# Patient Record
Sex: Female | Born: 1950
Health system: Southern US, Community
[De-identification: ages and names within clinical notes are randomized; demographics above are authoritative.]

## PROBLEM LIST (undated history)

## (undated) DIAGNOSIS — E669 Obesity, unspecified: Secondary | ICD-10-CM

## (undated) DIAGNOSIS — K449 Diaphragmatic hernia without obstruction or gangrene: Secondary | ICD-10-CM

## (undated) DIAGNOSIS — N200 Calculus of kidney: Secondary | ICD-10-CM

## (undated) DIAGNOSIS — K219 Gastro-esophageal reflux disease without esophagitis: Secondary | ICD-10-CM

## (undated) DIAGNOSIS — E785 Hyperlipidemia, unspecified: Secondary | ICD-10-CM

## (undated) DIAGNOSIS — M1990 Unspecified osteoarthritis, unspecified site: Secondary | ICD-10-CM

## (undated) DIAGNOSIS — IMO0001 Reserved for inherently not codable concepts without codable children: Secondary | ICD-10-CM

## (undated) DIAGNOSIS — I10 Essential (primary) hypertension: Secondary | ICD-10-CM

## (undated) DIAGNOSIS — I482 Chronic atrial fibrillation, unspecified: Secondary | ICD-10-CM

## (undated) HISTORY — DX: Calculus of kidney: N20.0

## (undated) HISTORY — DX: Reserved for inherently not codable concepts without codable children: IMO0001

## (undated) HISTORY — DX: Unspecified osteoarthritis, unspecified site: M19.90

## (undated) HISTORY — PX: ABDOMINAL HYSTERECTOMY: SHX81

## (undated) HISTORY — DX: Obesity, unspecified: E66.9

## (undated) HISTORY — DX: Gastro-esophageal reflux disease without esophagitis: K21.9

## (undated) HISTORY — DX: Diaphragmatic hernia without obstruction or gangrene: K44.9

## (undated) HISTORY — DX: Chronic atrial fibrillation, unspecified: I48.20

## (undated) HISTORY — PX: CHOLECYSTECTOMY: SHX55

---

## 1998-09-26 ENCOUNTER — Emergency Department (HOSPITAL_COMMUNITY): Admission: EM | Admit: 1998-09-26 | Discharge: 1998-09-26 | Payer: Self-pay | Admitting: Emergency Medicine

## 1999-07-30 ENCOUNTER — Ambulatory Visit (HOSPITAL_COMMUNITY): Admission: RE | Admit: 1999-07-30 | Discharge: 1999-07-30 | Payer: Self-pay | Admitting: Family Medicine

## 2000-08-15 ENCOUNTER — Encounter: Payer: Self-pay | Admitting: Emergency Medicine

## 2000-08-15 ENCOUNTER — Emergency Department (HOSPITAL_COMMUNITY): Admission: EM | Admit: 2000-08-15 | Discharge: 2000-08-15 | Payer: Self-pay | Admitting: Emergency Medicine

## 2001-05-12 ENCOUNTER — Encounter: Payer: Self-pay | Admitting: Family Medicine

## 2001-05-12 ENCOUNTER — Encounter: Admission: RE | Admit: 2001-05-12 | Discharge: 2001-05-12 | Payer: Self-pay | Admitting: Family Medicine

## 2004-09-11 ENCOUNTER — Inpatient Hospital Stay (HOSPITAL_COMMUNITY): Admission: AD | Admit: 2004-09-11 | Discharge: 2004-09-15 | Payer: Self-pay | Admitting: *Deleted

## 2004-12-17 ENCOUNTER — Observation Stay (HOSPITAL_COMMUNITY): Admission: EM | Admit: 2004-12-17 | Discharge: 2004-12-18 | Payer: Self-pay | Admitting: Emergency Medicine

## 2006-03-25 ENCOUNTER — Encounter: Admission: RE | Admit: 2006-03-25 | Discharge: 2006-03-25 | Payer: Self-pay | Admitting: Family Medicine

## 2011-01-13 ENCOUNTER — Emergency Department (HOSPITAL_BASED_OUTPATIENT_CLINIC_OR_DEPARTMENT_OTHER)
Admission: EM | Admit: 2011-01-13 | Discharge: 2011-01-14 | Disposition: A | Discharge status: Home / Self Care | Payer: Medicare Other | Attending: Emergency Medicine | Admitting: Emergency Medicine

## 2011-01-13 DIAGNOSIS — I10 Essential (primary) hypertension: Secondary | ICD-10-CM | POA: Insufficient documentation

## 2011-01-13 DIAGNOSIS — I4891 Unspecified atrial fibrillation: Secondary | ICD-10-CM | POA: Insufficient documentation

## 2011-01-13 DIAGNOSIS — K429 Umbilical hernia without obstruction or gangrene: Secondary | ICD-10-CM | POA: Insufficient documentation

## 2011-01-13 DIAGNOSIS — E669 Obesity, unspecified: Secondary | ICD-10-CM | POA: Insufficient documentation

## 2011-01-13 DIAGNOSIS — N1330 Unspecified hydronephrosis: Secondary | ICD-10-CM | POA: Insufficient documentation

## 2011-01-13 DIAGNOSIS — K219 Gastro-esophageal reflux disease without esophagitis: Secondary | ICD-10-CM | POA: Insufficient documentation

## 2011-01-13 DIAGNOSIS — E785 Hyperlipidemia, unspecified: Secondary | ICD-10-CM | POA: Insufficient documentation

## 2011-01-13 DIAGNOSIS — Z79899 Other long term (current) drug therapy: Secondary | ICD-10-CM | POA: Insufficient documentation

## 2011-01-13 LAB — URINE MICROSCOPIC-ADD ON

## 2011-01-13 LAB — URINALYSIS, ROUTINE W REFLEX MICROSCOPIC
Bilirubin Urine: NEGATIVE
Glucose, UA: NEGATIVE mg/dL
Ketones, ur: NEGATIVE mg/dL
Leukocytes, UA: NEGATIVE
Nitrite: NEGATIVE
Protein, ur: 30 mg/dL — AB
Specific Gravity, Urine: 1.013 (ref 1.005–1.030)
Urobilinogen, UA: 0.2 mg/dL (ref 0.0–1.0)
pH: 6.5 (ref 5.0–8.0)

## 2011-01-14 ENCOUNTER — Emergency Department (INDEPENDENT_AMBULATORY_CARE_PROVIDER_SITE_OTHER): Payer: Medicare Other

## 2011-01-14 DIAGNOSIS — Z90710 Acquired absence of both cervix and uterus: Secondary | ICD-10-CM

## 2011-01-14 DIAGNOSIS — Z9089 Acquired absence of other organs: Secondary | ICD-10-CM

## 2011-01-14 DIAGNOSIS — N201 Calculus of ureter: Secondary | ICD-10-CM

## 2011-01-14 LAB — CBC
HCT: 43.8 % (ref 36.0–46.0)
Hemoglobin: 15 g/dL (ref 12.0–15.0)
MCH: 28.1 pg (ref 26.0–34.0)
MCHC: 34.2 g/dL (ref 30.0–36.0)
MCV: 82 fL (ref 78.0–100.0)
RDW: 15.5 % (ref 11.5–15.5)

## 2011-01-14 LAB — DIFFERENTIAL
Basophils Relative: 0 % (ref 0–1)
Eosinophils Relative: 1 % (ref 0–5)
Lymphs Abs: 2.6 10*3/uL (ref 0.7–4.0)
Monocytes Absolute: 1.4 10*3/uL — ABNORMAL HIGH (ref 0.1–1.0)
Monocytes Relative: 10 % (ref 3–12)
Neutro Abs: 9.4 10*3/uL — ABNORMAL HIGH (ref 1.7–7.7)

## 2011-01-14 LAB — COMPREHENSIVE METABOLIC PANEL
ALT: 25 U/L (ref 0–35)
BUN: 20 mg/dL (ref 6–23)
CO2: 27 mEq/L (ref 19–32)
Calcium: 9.2 mg/dL (ref 8.4–10.5)
Creatinine, Ser: 0.7 mg/dL (ref 0.4–1.2)
GFR calc non Af Amer: >60 mL/min (ref >60)
Glucose, Bld: 121 mg/dL — ABNORMAL HIGH (ref 70–99)
Total Protein: 8 g/dL (ref 6.0–8.3)

## 2011-01-14 LAB — LIPASE, BLOOD: Lipase: 188 U/L (ref 23–300)

## 2011-01-14 LAB — APTT: aPTT: 78 seconds — ABNORMAL HIGH (ref 24–37)

## 2011-04-12 ENCOUNTER — Emergency Department (INDEPENDENT_AMBULATORY_CARE_PROVIDER_SITE_OTHER): Payer: Medicare Other

## 2011-04-12 ENCOUNTER — Emergency Department (HOSPITAL_BASED_OUTPATIENT_CLINIC_OR_DEPARTMENT_OTHER)
Admission: EM | Admit: 2011-04-12 | Discharge: 2011-04-12 | Disposition: A | Discharge status: Home / Self Care | Payer: Medicare Other | Attending: Emergency Medicine | Admitting: Emergency Medicine

## 2011-04-12 ENCOUNTER — Encounter: Payer: Self-pay | Admitting: *Deleted

## 2011-04-12 DIAGNOSIS — I4891 Unspecified atrial fibrillation: Secondary | ICD-10-CM | POA: Insufficient documentation

## 2011-04-12 DIAGNOSIS — W19XXXA Unspecified fall, initial encounter: Secondary | ICD-10-CM

## 2011-04-12 DIAGNOSIS — S0083XA Contusion of other part of head, initial encounter: Secondary | ICD-10-CM

## 2011-04-12 DIAGNOSIS — IMO0002 Reserved for concepts with insufficient information to code with codable children: Secondary | ICD-10-CM

## 2011-04-12 DIAGNOSIS — S0990XA Unspecified injury of head, initial encounter: Secondary | ICD-10-CM

## 2011-04-12 DIAGNOSIS — R51 Headache: Secondary | ICD-10-CM | POA: Insufficient documentation

## 2011-04-12 DIAGNOSIS — I10 Essential (primary) hypertension: Secondary | ICD-10-CM | POA: Insufficient documentation

## 2011-04-12 DIAGNOSIS — E785 Hyperlipidemia, unspecified: Secondary | ICD-10-CM | POA: Insufficient documentation

## 2011-04-12 DIAGNOSIS — S0003XA Contusion of scalp, initial encounter: Secondary | ICD-10-CM

## 2011-04-12 DIAGNOSIS — W101XXA Fall (on)(from) sidewalk curb, initial encounter: Secondary | ICD-10-CM | POA: Insufficient documentation

## 2011-04-12 HISTORY — DX: Essential (primary) hypertension: I10

## 2011-04-12 HISTORY — DX: Hyperlipidemia, unspecified: E78.5

## 2011-04-12 MED ORDER — KETOROLAC TROMETHAMINE 60 MG/2ML IM SOLN
60.0000 mg | Freq: Once | INTRAMUSCULAR | Status: DC
  Filled 2011-04-12: qty 2

## 2011-04-12 MED ORDER — HYDROCODONE-ACETAMINOPHEN 5-500 MG PO TABS: 1.0000 | ORAL_TABLET | Freq: Four times a day (QID) | ORAL | Status: AC | PRN

## 2011-04-12 NOTE — ED Provider Notes (Signed)
History     Chief Complaint  Patient presents with  . Fall   HPI Comments: Pt with fall that occurred approx 2.5 hours ago when she was stepping up a curb and hit her R forehead and upper lip on the concrete.  She tried to catch herself with her R hand but it was a little too late and now she has pain in her face, tooth and orrehead.  Pain is constant, mild and worse with palpation, mild associated headache but no LOC, visual change and has been ambulatory without difficulty.  She is on coumadin for afib.  Patient is a 60 y.o. female presenting with fall. The history is provided by the patient.  Fall Associated symptoms include headaches. Pertinent negatives include no fever, no numbness, no abdominal pain, no nausea and no vomiting.    Past Medical History  Diagnosis Date  . Atrial fibrillation   . Hypertension   . Hyperlipidemia     Past Surgical History  Procedure Date  . Abdominal hysterectomy   . Cholecystectomy     History reviewed. No pertinent family history.  History  Substance Use Topics  . Smoking status: Never Smoker   . Smokeless tobacco: Not on file  . Alcohol Use:     OB History    Grav Para Term Preterm Abortions TAB SAB Ect Mult Living                  Review of Systems  Constitutional: Negative for fever and chills.  HENT: Negative for sore throat and neck pain.   Eyes: Negative for visual disturbance.  Respiratory: Negative for cough and shortness of breath.   Cardiovascular: Negative for chest pain.  Gastrointestinal: Negative for nausea, vomiting, abdominal pain and diarrhea.  Genitourinary: Negative for dysuria and frequency.  Musculoskeletal: Negative for back pain.  Skin: Negative for rash.       bruising  Neurological: Positive for headaches. Negative for weakness and numbness.  Hematological: Negative for adenopathy.  Psychiatric/Behavioral: Negative for behavioral problems.    Physical Exam  BP 149/74  Pulse 83  Temp(Src) 98.6 F  (37 C) (Oral)  Resp 16  Wt 300 lb (136.079 kg)  SpO2 98%  Physical Exam  Constitutional: She appears well-developed and well-nourished. No distress.  HENT:  Head: Normocephalic.  Mouth/Throat: Oropharynx is clear and moist. No oropharyngeal exudate.       Contusion and abrasion to the R forehad, ttp ove the nasal bridge and ttp ove rthe upper R cdentral incisor without luxation.  Eyes: Conjunctivae and EOM are normal. Pupils are equal, round, and reactive to light. Right eye exhibits no discharge. Left eye exhibits no discharge. No scleral icterus.  Neck: Normal range of motion. Neck supple. No JVD present. No thyromegaly present.  Cardiovascular: Normal rate, regular rhythm, normal heart sounds and intact distal pulses.  Exam reveals no gallop and no friction rub.   No murmur heard. Pulmonary/Chest: Effort normal and breath sounds normal. No respiratory distress. She has no wheezes. She has no rales.  Abdominal: Soft. Bowel sounds are normal. She exhibits no distension and no mass. There is no tenderness.  Musculoskeletal: Normal range of motion. She exhibits no edema and no tenderness.       Entire spine non tender  Lymphadenopathy:    She has no cervical adenopathy.  Neurological: She is alert. Coordination normal.  Skin: Skin is warm and dry. No rash noted. She is not diaphoretic. No erythema.  No rash or abrasion over the hands, facial abrasion as noted  Psychiatric: She has a normal mood and affect. Her behavior is normal.    ED Course  Procedures  MDM Pt concerned due to anticoag - CT head and xray face to minimize radiation - has ambulated in the room, has unremark VS - pain meds deferred till after CT as she is here by driving self and can't have narcs and drive home - pt in agreement, Toradol if neg.   Xrays neg for frx or ICH.  Will d/c home.  Vida Roller, MD 04/12/11 1739

## 2011-04-12 NOTE — ED Notes (Signed)
Pt c/o fall from standing x 1 hr ago landing on cement, abrasion to head and upper lip

## 2011-09-08 ENCOUNTER — Emergency Department (HOSPITAL_COMMUNITY)
Admission: EM | Admit: 2011-09-08 | Discharge: 2011-09-08 | Disposition: A | Discharge status: Home / Self Care | Payer: Medicare Other | Attending: Emergency Medicine | Admitting: Emergency Medicine

## 2011-09-08 ENCOUNTER — Encounter (HOSPITAL_COMMUNITY): Payer: Self-pay | Admitting: Emergency Medicine

## 2011-09-08 DIAGNOSIS — M5380 Other specified dorsopathies, site unspecified: Secondary | ICD-10-CM | POA: Insufficient documentation

## 2011-09-08 DIAGNOSIS — I10 Essential (primary) hypertension: Secondary | ICD-10-CM | POA: Insufficient documentation

## 2011-09-08 DIAGNOSIS — Z7901 Long term (current) use of anticoagulants: Secondary | ICD-10-CM | POA: Insufficient documentation

## 2011-09-08 DIAGNOSIS — E785 Hyperlipidemia, unspecified: Secondary | ICD-10-CM | POA: Insufficient documentation

## 2011-09-08 DIAGNOSIS — M62830 Muscle spasm of back: Secondary | ICD-10-CM

## 2011-09-08 DIAGNOSIS — M549 Dorsalgia, unspecified: Secondary | ICD-10-CM

## 2011-09-08 DIAGNOSIS — I4891 Unspecified atrial fibrillation: Secondary | ICD-10-CM | POA: Insufficient documentation

## 2011-09-08 LAB — URINALYSIS, ROUTINE W REFLEX MICROSCOPIC
Bilirubin Urine: NEGATIVE
Glucose, UA: NEGATIVE mg/dL
Hgb urine dipstick: NEGATIVE
Ketones, ur: NEGATIVE mg/dL
Nitrite: NEGATIVE
Protein, ur: NEGATIVE mg/dL
Specific Gravity, Urine: 1.007 (ref 1.005–1.030)
Urobilinogen, UA: 0.2 mg/dL (ref 0.0–1.0)
pH: 6.5 (ref 5.0–8.0)

## 2011-09-08 LAB — URINE MICROSCOPIC-ADD ON

## 2011-09-08 MED ORDER — GI COCKTAIL ~~LOC~~
30.0000 mL | Freq: Once | ORAL | Status: DC
  Filled 2011-09-08: qty 30

## 2011-09-08 MED ORDER — DIAZEPAM 5 MG PO TABS: 5.0000 mg | ORAL_TABLET | Freq: Four times a day (QID) | ORAL | Status: AC | PRN

## 2011-09-08 MED ORDER — ONDANSETRON 8 MG PO TBDP
8.0000 mg | ORAL_TABLET | Freq: Once | ORAL | Status: AC
  Administered 2011-09-08: 8 mg via ORAL
  Filled 2011-09-08: qty 1

## 2011-09-08 MED ORDER — DIAZEPAM 5 MG PO TABS
5.0000 mg | ORAL_TABLET | Freq: Once | ORAL | Status: AC
  Administered 2011-09-08: 5 mg via ORAL
  Filled 2011-09-08: qty 1

## 2011-09-08 MED ORDER — OXYCODONE-ACETAMINOPHEN 5-325 MG PO TABS: 1.0000 | ORAL_TABLET | ORAL | Status: AC | PRN

## 2011-09-08 MED ORDER — ONDANSETRON HCL 4 MG/2ML IJ SOLN: 4.0000 mg | Freq: Once | INTRAMUSCULAR | Status: DC

## 2011-09-08 MED ORDER — HYDROMORPHONE HCL PF 2 MG/ML IJ SOLN
2.0000 mg | Freq: Once | INTRAMUSCULAR | Status: AC
  Administered 2011-09-08: 2 mg via INTRAMUSCULAR
  Filled 2011-09-08: qty 1

## 2011-09-08 NOTE — ED Provider Notes (Signed)
Medical screening examination/treatment/procedure(s) were performed by non-physician practitioner and as supervising physician I was immediately available for consultation/collaboration.  Cyndra Numbers, MD 09/08/11 [phone]

## 2011-09-08 NOTE — ED Provider Notes (Signed)
Medical screening examination/treatment/procedure(s) were performed by non-physician practitioner and as supervising physician I was immediately available for consultation/collaboration.  Annaleia Pence, MD 09/08/11 1559 

## 2011-09-08 NOTE — ED Notes (Signed)
Pt states she is having pain in her back that started last night and has progressively gotten worse throughout the day today  Pt states the pain was so bad earlier she could not walk on her own  Pt states she has back pain before but has never been treated for back pain

## 2011-09-08 NOTE — ED Provider Notes (Signed)
History     CSN: [id number] Arrival date & time: 09/08/2011  1:37 AM   First MD Initiated Contact with Patient 09/08/11 0413      Chief Complaint  Patient presents with  . Back Pain    Patient is a 60 y.o. female presenting with back pain. The history is provided by the patient.  Back Pain  This is a new problem. The current episode started 2 days ago. The problem occurs constantly. The problem has been rapidly worsening. The pain is associated with no known injury. The pain is present in the lumbar spine. The quality of the pain is described as shooting and stabbing. The pain does not radiate. The pain is at a severity of 8/10. Pertinent negatives include no chest pain, no fever, no numbness, no abdominal pain, no bowel incontinence, no perianal numbness, no bladder incontinence, no dysuria, no pelvic pain, no leg pain, no paresthesias, no paresis, no tingling and no weakness. Risk factors include obesity, lack of exercise and a sedentary lifestyle.  Reports onset of muscle spasms to the right lower back Monday evening. Pain initially mild to moderate and patient states was able to manage at home. States that last night pain became severe. States she was unable to ambulate without assistance due to pain. Pain worse with position change and ambulation. Patient denies nausea, vomiting, diarrhea, fever, abdominal pain, UTI sx's, cough or other associated symptoms. States several months ago was seen at [facility] and told that CT scan suggested she may have passed a kidney stone. Patient states this pain is not similar to the pain she experienced with his previous episode.  Past Medical History  Diagnosis Date  . Atrial fibrillation   . Hypertension   . Hyperlipidemia     Past Surgical History  Procedure Date  . Abdominal hysterectomy   . Cholecystectomy     Family History  Problem Relation Age of Onset  . Heart disease Other   . Diabetes Other   . Cancer Other      History  Substance Use Topics  . Smoking status: Never Smoker   . Smokeless tobacco: Not on file  . Alcohol Use: No    OB History    Grav Para Term Preterm Abortions TAB SAB Ect Mult Living                  Review of Systems  Constitutional: Negative.  Negative for fever.  HENT: Negative.   Eyes: Negative.   Respiratory: Negative.   Cardiovascular: Negative.  Negative for chest pain.  Gastrointestinal: Negative.  Negative for abdominal pain and bowel incontinence.  Genitourinary: Negative.  Negative for bladder incontinence, dysuria and pelvic pain.  Musculoskeletal: Positive for back pain.  Skin: Negative.   Neurological: Negative.  Negative for tingling, weakness, numbness and paresthesias.  Hematological: Negative.   Psychiatric/Behavioral: Negative.     Allergies  Demerol and Erythrocin  Home Medications   Current Outpatient Rx  Name Route Sig Dispense Refill  . DILTIAZEM HCL ER COATED BEADS 180 MG PO CP24 Oral Take 180 mg by mouth daily.      Marland Kitchen PRAVASTATIN SODIUM 40 MG PO TABS Oral Take 40 mg by mouth daily.      . WARFARIN SODIUM 5 MG PO TABS Oral Take 7.5 mg by mouth daily. On Monday and Friday patient takes 7.5 all other days 5mg     . ZOLPIDEM TARTRATE ER 12.5 MG PO TBCR Oral Take 12.5 mg by mouth at  bedtime as needed.        BP 140/73  Pulse 78  Resp 20  SpO2 98%  Physical Exam  Constitutional: She is oriented to person, place, and time. She appears well-developed and well-nourished.  HENT:  Head: Normocephalic and atraumatic.  Eyes: Conjunctivae are normal.  Neck: Neck supple.  Cardiovascular: Normal rate and regular rhythm.   Pulmonary/Chest: Effort normal and breath sounds normal.  Abdominal: Soft. Bowel sounds are normal.  Musculoskeletal: Normal range of motion.       Arms: Neurological: She is alert and oriented to person, place, and time.  Skin: Skin is warm and dry. No erythema.  Psychiatric: She has a normal mood and affect.    ED  Course  Procedures patient with low back pain that is not associated with other symptoms. Denies neurological symptoms. Will check  U/a, medicate for pain and reassess. I have discussed this patient with Lissette pass PA who has agreed to assume care of the patient pending UA, reassessment and disposition.   Labs Reviewed  URINALYSIS, ROUTINE W REFLEX MICROSCOPIC   No results found.   No diagnosis found.    MDM  LBP likely musculoskeletal. U/A pending        Leanne Chang, NP 09/08/11 [phone]

## 2011-09-08 NOTE — ED Provider Notes (Signed)
6:16 AM Patient care resumed from [facility].  She states that the patient has been suffering from right lumbar spinal/CVA back spasms.  Per her plan we will treat the patient's pain with Dilaudid and Valium.  UA pending.  If hematuria is found plan to CT for possible kidney stones.  Of note the patient states this pain is not similar to her last stone episode.  Patient will be discharged with close followup with her PCP if symptoms continue and will be given Percocet and Valium for pain and spasms.  7:10 AM UA still pending. Nurse calling lab on status. Pt states having reflux. PO GI cocktail ordered w another round of Zofran. Pt denies being in any pain.   7:21 AM UA complete and WNL. Pt dc w PCP follow up instructions, valium, & percocet per Ms. Schorr's plan   [place], [place] 09/08/11 [phone]

## 2013-07-09 ENCOUNTER — Other Ambulatory Visit: Payer: Self-pay | Admitting: Cardiology

## 2013-07-09 DIAGNOSIS — I10 Essential (primary) hypertension: Secondary | ICD-10-CM

## 2013-07-10 NOTE — Telephone Encounter (Signed)
Pt needs to be set up for annual appt before I can send in refills. Will send in one refill for pt so they have time to set up appt.

## 2013-09-17 ENCOUNTER — Other Ambulatory Visit: Payer: Self-pay | Admitting: Cardiology

## 2013-10-25 ENCOUNTER — Telehealth: Payer: Self-pay

## 2013-10-25 MED ORDER — DILTIAZEM HCL ER COATED BEADS 180 MG PO CP24: 180.0000 mg | ORAL_CAPSULE | Freq: Every day | ORAL | Status: DC

## 2013-10-25 NOTE — Telephone Encounter (Signed)
Pt needs OV. Have not seen since 05/2012. Will send one month worth of meds in.. Please advise pt to make appt with Dr Radford Pax

## 2014-01-16 ENCOUNTER — Other Ambulatory Visit: Payer: Self-pay | Admitting: Cardiology

## 2014-01-22 ENCOUNTER — Encounter: Payer: Self-pay | Admitting: General Surgery

## 2014-01-22 DIAGNOSIS — I495 Sick sinus syndrome: Secondary | ICD-10-CM

## 2014-01-22 DIAGNOSIS — I4891 Unspecified atrial fibrillation: Secondary | ICD-10-CM

## 2014-01-22 DIAGNOSIS — I10 Essential (primary) hypertension: Secondary | ICD-10-CM

## 2014-02-04 ENCOUNTER — Ambulatory Visit (INDEPENDENT_AMBULATORY_CARE_PROVIDER_SITE_OTHER): Payer: Commercial Managed Care - HMO | Admitting: Cardiology

## 2014-02-04 ENCOUNTER — Encounter: Payer: Self-pay | Admitting: Cardiology

## 2014-02-04 VITALS — BP 158/97 | HR 79 | Ht 62.5 in | Wt 286.8 lb

## 2014-02-04 DIAGNOSIS — I482 Chronic atrial fibrillation, unspecified: Secondary | ICD-10-CM | POA: Insufficient documentation

## 2014-02-04 DIAGNOSIS — I4891 Unspecified atrial fibrillation: Secondary | ICD-10-CM

## 2014-02-04 DIAGNOSIS — I10 Essential (primary) hypertension: Secondary | ICD-10-CM

## 2014-02-04 NOTE — Progress Notes (Signed)
[place], [place], [place]  [address] Phone: [phone] Fax:  [phone]  Date:  02/04/2014   ID:  Nancy Thomas, DOB 03-28-1951, MRN [id number]  PCP:  Gara Kroner, MD  Cardiologist:  Fransico Him, MD     History of Present Illness: This is a 63yo WF with a history of chronic atrial fibrillation, chronic systemic anticogulation and HTN who presents today for followup.  She is doing well.  She denies any chest pain, SOB, DOE,  dizziness, palpitations or syncope.  SHe occasionally will have some LE edema is she has been sitting for prolonged periods of time.  Wt Readings from Last 3 Encounters:  02/04/14 286 lb 12.8 oz (130.092 kg)  04/12/11 300 lb (136.079 kg)     Past Medical History  Diagnosis Date  . Hyperlipidemia   . Chronic atrial fibrillation     pt did not want to proceed w cardioversion in may 2012 and is asymptomatic in her afib. on chronic systemic anticoagulations  . Obesity   . Reflux   . Hiatal hernia   . Arthritis     in knees, Severe  . Kidney stone   . Hypertension     Current Outpatient Prescriptions  Medication Sig Dispense Refill  . diltiazem (CARTIA XT) 180 MG 24 hr capsule Take 1 capsule (180 mg total) by mouth daily.  90 capsule  0  . Multiple Vitamin tablet Take 1 tablet by mouth daily.       . pravastatin (PRAVACHOL) 40 MG tablet Take 40 mg by mouth daily.        . ranitidine (ZANTAC) 150 MG capsule Take 150 mg by mouth as needed for heartburn.      . warfarin (COUMADIN) 5 MG tablet Take 7.5 mg by mouth daily. On Monday and Friday patient takes 7.5 all other days 5mg       . zolpidem (AMBIEN) 5 MG tablet Take 5 mg by mouth at bedtime as needed for sleep.       No current facility-administered medications for this visit.    Allergies:    Allergies  Allergen Reactions  . Demerol Nausea And Vomiting  . Erythrocin Nausea And Vomiting  . Lisinopril     Angioedema     Social History:  The patient  reports that she has never  smoked. She does not have any smokeless tobacco history on file. She reports that she does not drink alcohol or use illicit drugs.   Family History:  The patient's family history includes Cancer in her other; Diabetes in her other; Heart disease in her other.   ROS:  Please see the history of present illness.      All other systems reviewed and negative.   PHYSICAL EXAM: VS:  BP 158/97  Pulse 79  Ht 5' 2.5" (1.588 m)  Wt 286 lb 12.8 oz (130.092 kg)  BMI 51.59 kg/m2 Well nourished, well developed, in no acute distress HEENT: normal Neck: no JVD Cardiac:  normal S1, S2; RRR; no murmur Lungs:  clear to auscultation bilaterally, no wheezing, rhonchi or rales Abd: soft, nontender, no hepatomegaly Ext: trace edema Skin: warm and dry Neuro:  CNs 2-12 intact, no focal abnormalities noted  EKG:     Atrial fibrillation with CVR and no ST changes  ASSESSMENT AND PLAN:  1. Chronic atrial fibrillation rate controlled - continue Diltiazem/warfarin 2. HTN elevated today but normal at her PCP office the other day - continue Diltiazem - I have asked  her to check her BP daily for a week and call with results 3. Chronic systemic anticoagulation  Followup with me in 1 year  Signed, Fransico Him, MD 02/04/2014 3:19 PM

## 2014-02-04 NOTE — Patient Instructions (Signed)
Your physician recommends that you continue on your current medications as directed. Please refer to the Current Medication list given to you today.  Your physician has requested that you regularly monitor and record your blood pressure readings at home. Please use the same machine at the same time of day to check your readings and record them for one week and then call us with the results.   Your physician wants you to follow-up in: 1 year with Dr Mallie Snooks will receive a reminder letter in the mail two months in advance. If you don't receive a letter, please call our office to schedule the follow-up appointment.

## 2014-05-08 ENCOUNTER — Other Ambulatory Visit: Payer: Self-pay | Admitting: *Deleted

## 2014-05-08 MED ORDER — DILTIAZEM HCL ER COATED BEADS 180 MG PO CP24: 180.0000 mg | ORAL_CAPSULE | Freq: Every day | ORAL | Status: DC

## 2014-08-01 ENCOUNTER — Other Ambulatory Visit: Payer: Self-pay | Admitting: Cardiology

## 2014-10-24 DIAGNOSIS — Z7901 Long term (current) use of anticoagulants: Secondary | ICD-10-CM | POA: Diagnosis not present

## 2014-10-24 DIAGNOSIS — I4891 Unspecified atrial fibrillation: Secondary | ICD-10-CM | POA: Diagnosis not present

## 2014-11-11 ENCOUNTER — Encounter: Payer: Self-pay | Admitting: Cardiology

## 2014-11-21 DIAGNOSIS — I4891 Unspecified atrial fibrillation: Secondary | ICD-10-CM | POA: Diagnosis not present

## 2014-11-21 DIAGNOSIS — Z7901 Long term (current) use of anticoagulants: Secondary | ICD-10-CM | POA: Diagnosis not present

## 2014-11-29 ENCOUNTER — Encounter: Payer: Self-pay | Admitting: Cardiology

## 2014-12-05 DIAGNOSIS — I480 Paroxysmal atrial fibrillation: Secondary | ICD-10-CM | POA: Diagnosis not present

## 2014-12-05 DIAGNOSIS — Z7901 Long term (current) use of anticoagulants: Secondary | ICD-10-CM | POA: Diagnosis not present

## 2014-12-18 DIAGNOSIS — I4891 Unspecified atrial fibrillation: Secondary | ICD-10-CM | POA: Diagnosis not present

## 2014-12-18 DIAGNOSIS — Z7901 Long term (current) use of anticoagulants: Secondary | ICD-10-CM | POA: Diagnosis not present

## 2015-01-02 DIAGNOSIS — I4891 Unspecified atrial fibrillation: Secondary | ICD-10-CM | POA: Diagnosis not present

## 2015-01-02 DIAGNOSIS — Z7901 Long term (current) use of anticoagulants: Secondary | ICD-10-CM | POA: Diagnosis not present

## 2015-01-31 DIAGNOSIS — E119 Type 2 diabetes mellitus without complications: Secondary | ICD-10-CM | POA: Diagnosis not present

## 2015-01-31 DIAGNOSIS — E782 Mixed hyperlipidemia: Secondary | ICD-10-CM | POA: Diagnosis not present

## 2015-03-04 DIAGNOSIS — Z7901 Long term (current) use of anticoagulants: Secondary | ICD-10-CM | POA: Diagnosis not present

## 2015-03-04 DIAGNOSIS — I4891 Unspecified atrial fibrillation: Secondary | ICD-10-CM | POA: Diagnosis not present

## 2015-04-07 DIAGNOSIS — Z7901 Long term (current) use of anticoagulants: Secondary | ICD-10-CM | POA: Diagnosis not present

## 2015-04-07 DIAGNOSIS — I4891 Unspecified atrial fibrillation: Secondary | ICD-10-CM | POA: Diagnosis not present

## 2015-04-16 DIAGNOSIS — R0789 Other chest pain: Secondary | ICD-10-CM | POA: Diagnosis not present

## 2015-04-16 DIAGNOSIS — S9031XA Contusion of right foot, initial encounter: Secondary | ICD-10-CM | POA: Diagnosis not present

## 2015-04-16 DIAGNOSIS — S20219A Contusion of unspecified front wall of thorax, initial encounter: Secondary | ICD-10-CM | POA: Diagnosis not present

## 2015-05-06 DIAGNOSIS — Z7901 Long term (current) use of anticoagulants: Secondary | ICD-10-CM | POA: Diagnosis not present

## 2015-05-06 DIAGNOSIS — I4891 Unspecified atrial fibrillation: Secondary | ICD-10-CM | POA: Diagnosis not present

## 2015-06-03 DIAGNOSIS — I4891 Unspecified atrial fibrillation: Secondary | ICD-10-CM | POA: Diagnosis not present

## 2015-06-03 DIAGNOSIS — Z7901 Long term (current) use of anticoagulants: Secondary | ICD-10-CM | POA: Diagnosis not present

## 2015-06-10 DIAGNOSIS — I4891 Unspecified atrial fibrillation: Secondary | ICD-10-CM | POA: Diagnosis not present

## 2015-06-10 DIAGNOSIS — Z7901 Long term (current) use of anticoagulants: Secondary | ICD-10-CM | POA: Diagnosis not present

## 2015-06-24 DIAGNOSIS — Z7901 Long term (current) use of anticoagulants: Secondary | ICD-10-CM | POA: Diagnosis not present

## 2015-06-24 DIAGNOSIS — I4891 Unspecified atrial fibrillation: Secondary | ICD-10-CM | POA: Diagnosis not present

## 2015-07-08 DIAGNOSIS — I4891 Unspecified atrial fibrillation: Secondary | ICD-10-CM | POA: Diagnosis not present

## 2015-07-08 DIAGNOSIS — Z7901 Long term (current) use of anticoagulants: Secondary | ICD-10-CM | POA: Diagnosis not present

## 2015-08-05 DIAGNOSIS — I4891 Unspecified atrial fibrillation: Secondary | ICD-10-CM | POA: Diagnosis not present

## 2015-08-05 DIAGNOSIS — Z7901 Long term (current) use of anticoagulants: Secondary | ICD-10-CM | POA: Diagnosis not present

## 2015-08-19 DIAGNOSIS — I4891 Unspecified atrial fibrillation: Secondary | ICD-10-CM | POA: Diagnosis not present

## 2015-08-19 DIAGNOSIS — Z7901 Long term (current) use of anticoagulants: Secondary | ICD-10-CM | POA: Diagnosis not present

## 2015-09-02 DIAGNOSIS — I4891 Unspecified atrial fibrillation: Secondary | ICD-10-CM | POA: Diagnosis not present

## 2015-09-02 DIAGNOSIS — Z7901 Long term (current) use of anticoagulants: Secondary | ICD-10-CM | POA: Diagnosis not present

## 2015-10-07 DIAGNOSIS — Z7901 Long term (current) use of anticoagulants: Secondary | ICD-10-CM | POA: Diagnosis not present

## 2015-10-07 DIAGNOSIS — I4891 Unspecified atrial fibrillation: Secondary | ICD-10-CM | POA: Diagnosis not present

## 2015-11-04 DIAGNOSIS — I4891 Unspecified atrial fibrillation: Secondary | ICD-10-CM | POA: Diagnosis not present

## 2015-11-04 DIAGNOSIS — Z7901 Long term (current) use of anticoagulants: Secondary | ICD-10-CM | POA: Diagnosis not present

## 2015-11-20 DIAGNOSIS — Z7901 Long term (current) use of anticoagulants: Secondary | ICD-10-CM | POA: Diagnosis not present

## 2015-11-20 DIAGNOSIS — I4891 Unspecified atrial fibrillation: Secondary | ICD-10-CM | POA: Diagnosis not present

## 2015-12-02 DIAGNOSIS — Z7901 Long term (current) use of anticoagulants: Secondary | ICD-10-CM | POA: Diagnosis not present

## 2015-12-02 DIAGNOSIS — I4891 Unspecified atrial fibrillation: Secondary | ICD-10-CM | POA: Diagnosis not present

## 2015-12-20 DIAGNOSIS — B349 Viral infection, unspecified: Secondary | ICD-10-CM | POA: Diagnosis not present

## 2015-12-20 DIAGNOSIS — R5383 Other fatigue: Secondary | ICD-10-CM | POA: Diagnosis not present

## 2015-12-30 DIAGNOSIS — I4891 Unspecified atrial fibrillation: Secondary | ICD-10-CM | POA: Diagnosis not present

## 2015-12-30 DIAGNOSIS — Z7901 Long term (current) use of anticoagulants: Secondary | ICD-10-CM | POA: Diagnosis not present

## 2016-01-08 DIAGNOSIS — Z7901 Long term (current) use of anticoagulants: Secondary | ICD-10-CM | POA: Diagnosis not present

## 2016-01-08 DIAGNOSIS — I4891 Unspecified atrial fibrillation: Secondary | ICD-10-CM | POA: Diagnosis not present

## 2016-01-22 DIAGNOSIS — Z7901 Long term (current) use of anticoagulants: Secondary | ICD-10-CM | POA: Diagnosis not present

## 2016-01-22 DIAGNOSIS — I4891 Unspecified atrial fibrillation: Secondary | ICD-10-CM | POA: Diagnosis not present

## 2016-02-17 DIAGNOSIS — Z0000 Encounter for general adult medical examination without abnormal findings: Secondary | ICD-10-CM | POA: Diagnosis not present

## 2016-02-17 DIAGNOSIS — I4891 Unspecified atrial fibrillation: Secondary | ICD-10-CM | POA: Diagnosis not present

## 2016-02-17 DIAGNOSIS — R609 Edema, unspecified: Secondary | ICD-10-CM | POA: Diagnosis not present

## 2016-02-17 DIAGNOSIS — E782 Mixed hyperlipidemia: Secondary | ICD-10-CM | POA: Diagnosis not present

## 2016-02-17 DIAGNOSIS — J309 Allergic rhinitis, unspecified: Secondary | ICD-10-CM | POA: Diagnosis not present

## 2016-02-17 DIAGNOSIS — E119 Type 2 diabetes mellitus without complications: Secondary | ICD-10-CM | POA: Diagnosis not present

## 2016-02-17 DIAGNOSIS — K219 Gastro-esophageal reflux disease without esophagitis: Secondary | ICD-10-CM | POA: Diagnosis not present

## 2016-02-17 DIAGNOSIS — Z87442 Personal history of urinary calculi: Secondary | ICD-10-CM | POA: Diagnosis not present

## 2016-02-17 DIAGNOSIS — I10 Essential (primary) hypertension: Secondary | ICD-10-CM | POA: Diagnosis not present

## 2016-02-18 ENCOUNTER — Other Ambulatory Visit: Payer: Self-pay | Admitting: Family Medicine

## 2016-02-18 DIAGNOSIS — Z1231 Encounter for screening mammogram for malignant neoplasm of breast: Secondary | ICD-10-CM

## 2016-03-02 ENCOUNTER — Ambulatory Visit
Admission: RE | Admit: 2016-03-02 | Discharge: 2016-03-02 | Disposition: A | Discharge status: Home / Self Care | Payer: Commercial Managed Care - HMO | Source: Ambulatory Visit | Attending: Family Medicine | Admitting: Family Medicine

## 2016-03-02 DIAGNOSIS — Z1231 Encounter for screening mammogram for malignant neoplasm of breast: Secondary | ICD-10-CM

## 2016-03-04 ENCOUNTER — Other Ambulatory Visit: Payer: Self-pay | Admitting: Family Medicine

## 2016-03-04 DIAGNOSIS — R928 Other abnormal and inconclusive findings on diagnostic imaging of breast: Secondary | ICD-10-CM

## 2016-03-12 ENCOUNTER — Ambulatory Visit
Admission: RE | Admit: 2016-03-12 | Discharge: 2016-03-12 | Disposition: A | Discharge status: Home / Self Care | Payer: Commercial Managed Care - HMO | Source: Ambulatory Visit | Attending: Family Medicine | Admitting: Family Medicine

## 2016-03-12 ENCOUNTER — Other Ambulatory Visit: Payer: Self-pay | Admitting: Family Medicine

## 2016-03-12 DIAGNOSIS — R921 Mammographic calcification found on diagnostic imaging of breast: Secondary | ICD-10-CM

## 2016-03-12 DIAGNOSIS — R928 Other abnormal and inconclusive findings on diagnostic imaging of breast: Secondary | ICD-10-CM

## 2016-03-16 ENCOUNTER — Ambulatory Visit
Admission: RE | Admit: 2016-03-16 | Discharge: 2016-03-16 | Disposition: A | Discharge status: Home / Self Care | Payer: Commercial Managed Care - HMO | Source: Ambulatory Visit | Attending: Family Medicine | Admitting: Family Medicine

## 2016-03-16 DIAGNOSIS — I4891 Unspecified atrial fibrillation: Secondary | ICD-10-CM | POA: Diagnosis not present

## 2016-03-16 DIAGNOSIS — R921 Mammographic calcification found on diagnostic imaging of breast: Secondary | ICD-10-CM

## 2016-03-16 DIAGNOSIS — D241 Benign neoplasm of right breast: Secondary | ICD-10-CM | POA: Diagnosis not present

## 2016-03-16 DIAGNOSIS — Z7901 Long term (current) use of anticoagulants: Secondary | ICD-10-CM | POA: Diagnosis not present

## 2016-04-14 DIAGNOSIS — I4891 Unspecified atrial fibrillation: Secondary | ICD-10-CM | POA: Diagnosis not present

## 2016-04-14 DIAGNOSIS — Z7901 Long term (current) use of anticoagulants: Secondary | ICD-10-CM | POA: Diagnosis not present

## 2016-04-30 DIAGNOSIS — I4891 Unspecified atrial fibrillation: Secondary | ICD-10-CM | POA: Diagnosis not present

## 2016-04-30 DIAGNOSIS — Z7901 Long term (current) use of anticoagulants: Secondary | ICD-10-CM | POA: Diagnosis not present

## 2016-05-27 DIAGNOSIS — Z7901 Long term (current) use of anticoagulants: Secondary | ICD-10-CM | POA: Diagnosis not present

## 2016-05-27 DIAGNOSIS — I4891 Unspecified atrial fibrillation: Secondary | ICD-10-CM | POA: Diagnosis not present

## 2016-06-08 DIAGNOSIS — Z7901 Long term (current) use of anticoagulants: Secondary | ICD-10-CM | POA: Diagnosis not present

## 2016-06-08 DIAGNOSIS — I4891 Unspecified atrial fibrillation: Secondary | ICD-10-CM | POA: Diagnosis not present

## 2016-06-22 DIAGNOSIS — I4891 Unspecified atrial fibrillation: Secondary | ICD-10-CM | POA: Diagnosis not present

## 2016-06-22 DIAGNOSIS — Z7901 Long term (current) use of anticoagulants: Secondary | ICD-10-CM | POA: Diagnosis not present

## 2016-07-08 DIAGNOSIS — Z7901 Long term (current) use of anticoagulants: Secondary | ICD-10-CM | POA: Diagnosis not present

## 2016-07-22 DIAGNOSIS — Z7901 Long term (current) use of anticoagulants: Secondary | ICD-10-CM | POA: Diagnosis not present

## 2016-07-22 DIAGNOSIS — I4891 Unspecified atrial fibrillation: Secondary | ICD-10-CM | POA: Diagnosis not present

## 2016-08-09 DIAGNOSIS — I4891 Unspecified atrial fibrillation: Secondary | ICD-10-CM | POA: Diagnosis not present

## 2016-08-09 DIAGNOSIS — Z7901 Long term (current) use of anticoagulants: Secondary | ICD-10-CM | POA: Diagnosis not present

## 2016-09-07 DIAGNOSIS — Z7901 Long term (current) use of anticoagulants: Secondary | ICD-10-CM | POA: Diagnosis not present

## 2016-09-07 DIAGNOSIS — I4891 Unspecified atrial fibrillation: Secondary | ICD-10-CM | POA: Diagnosis not present

## 2016-09-23 DIAGNOSIS — Z7901 Long term (current) use of anticoagulants: Secondary | ICD-10-CM | POA: Diagnosis not present

## 2016-09-23 DIAGNOSIS — I4891 Unspecified atrial fibrillation: Secondary | ICD-10-CM | POA: Diagnosis not present

## 2016-09-30 DIAGNOSIS — Z7901 Long term (current) use of anticoagulants: Secondary | ICD-10-CM | POA: Diagnosis not present

## 2016-09-30 DIAGNOSIS — I4891 Unspecified atrial fibrillation: Secondary | ICD-10-CM | POA: Diagnosis not present

## 2016-10-26 DIAGNOSIS — Z7901 Long term (current) use of anticoagulants: Secondary | ICD-10-CM | POA: Diagnosis not present

## 2016-10-26 DIAGNOSIS — I4891 Unspecified atrial fibrillation: Secondary | ICD-10-CM | POA: Diagnosis not present

## 2016-11-23 DIAGNOSIS — I4891 Unspecified atrial fibrillation: Secondary | ICD-10-CM | POA: Diagnosis not present

## 2016-11-23 DIAGNOSIS — Z7901 Long term (current) use of anticoagulants: Secondary | ICD-10-CM | POA: Diagnosis not present

## 2016-12-21 DIAGNOSIS — I4891 Unspecified atrial fibrillation: Secondary | ICD-10-CM | POA: Diagnosis not present

## 2016-12-21 DIAGNOSIS — Z7901 Long term (current) use of anticoagulants: Secondary | ICD-10-CM | POA: Diagnosis not present

## 2017-01-18 DIAGNOSIS — I4891 Unspecified atrial fibrillation: Secondary | ICD-10-CM | POA: Diagnosis not present

## 2017-01-18 DIAGNOSIS — Z7901 Long term (current) use of anticoagulants: Secondary | ICD-10-CM | POA: Diagnosis not present

## 2017-02-18 DIAGNOSIS — Z0000 Encounter for general adult medical examination without abnormal findings: Secondary | ICD-10-CM | POA: Diagnosis not present

## 2017-02-18 DIAGNOSIS — Z1159 Encounter for screening for other viral diseases: Secondary | ICD-10-CM | POA: Diagnosis not present

## 2017-02-18 DIAGNOSIS — Z23 Encounter for immunization: Secondary | ICD-10-CM | POA: Diagnosis not present

## 2017-02-18 DIAGNOSIS — I4891 Unspecified atrial fibrillation: Secondary | ICD-10-CM | POA: Diagnosis not present

## 2017-02-18 DIAGNOSIS — R609 Edema, unspecified: Secondary | ICD-10-CM | POA: Diagnosis not present

## 2017-02-18 DIAGNOSIS — Z87442 Personal history of urinary calculi: Secondary | ICD-10-CM | POA: Diagnosis not present

## 2017-02-18 DIAGNOSIS — K219 Gastro-esophageal reflux disease without esophagitis: Secondary | ICD-10-CM | POA: Diagnosis not present

## 2017-02-18 DIAGNOSIS — J309 Allergic rhinitis, unspecified: Secondary | ICD-10-CM | POA: Diagnosis not present

## 2017-02-18 DIAGNOSIS — I10 Essential (primary) hypertension: Secondary | ICD-10-CM | POA: Diagnosis not present

## 2017-02-18 DIAGNOSIS — E119 Type 2 diabetes mellitus without complications: Secondary | ICD-10-CM | POA: Diagnosis not present

## 2017-02-18 DIAGNOSIS — E782 Mixed hyperlipidemia: Secondary | ICD-10-CM | POA: Diagnosis not present

## 2017-03-03 DIAGNOSIS — J0100 Acute maxillary sinusitis, unspecified: Secondary | ICD-10-CM | POA: Diagnosis not present

## 2017-03-17 DIAGNOSIS — Z8679 Personal history of other diseases of the circulatory system: Secondary | ICD-10-CM | POA: Diagnosis not present

## 2017-03-17 DIAGNOSIS — Z7901 Long term (current) use of anticoagulants: Secondary | ICD-10-CM | POA: Diagnosis not present

## 2017-04-12 DIAGNOSIS — Z13820 Encounter for screening for osteoporosis: Secondary | ICD-10-CM | POA: Diagnosis not present

## 2017-04-12 DIAGNOSIS — Z780 Asymptomatic menopausal state: Secondary | ICD-10-CM | POA: Diagnosis not present

## 2017-04-12 DIAGNOSIS — M8588 Other specified disorders of bone density and structure, other site: Secondary | ICD-10-CM | POA: Diagnosis not present

## 2017-04-14 DIAGNOSIS — Z7901 Long term (current) use of anticoagulants: Secondary | ICD-10-CM | POA: Diagnosis not present

## 2017-04-14 DIAGNOSIS — I4891 Unspecified atrial fibrillation: Secondary | ICD-10-CM | POA: Diagnosis not present

## 2017-05-12 DIAGNOSIS — I4891 Unspecified atrial fibrillation: Secondary | ICD-10-CM | POA: Diagnosis not present

## 2017-05-12 DIAGNOSIS — Z7901 Long term (current) use of anticoagulants: Secondary | ICD-10-CM | POA: Diagnosis not present

## 2017-06-09 DIAGNOSIS — I4891 Unspecified atrial fibrillation: Secondary | ICD-10-CM | POA: Diagnosis not present

## 2017-06-09 DIAGNOSIS — Z7901 Long term (current) use of anticoagulants: Secondary | ICD-10-CM | POA: Diagnosis not present

## 2017-07-07 DIAGNOSIS — I4891 Unspecified atrial fibrillation: Secondary | ICD-10-CM | POA: Diagnosis not present

## 2017-07-07 DIAGNOSIS — Z7901 Long term (current) use of anticoagulants: Secondary | ICD-10-CM | POA: Diagnosis not present

## 2017-08-04 DIAGNOSIS — Z7901 Long term (current) use of anticoagulants: Secondary | ICD-10-CM | POA: Diagnosis not present

## 2017-09-02 DIAGNOSIS — I4891 Unspecified atrial fibrillation: Secondary | ICD-10-CM | POA: Diagnosis not present

## 2017-09-02 DIAGNOSIS — Z7901 Long term (current) use of anticoagulants: Secondary | ICD-10-CM | POA: Diagnosis not present

## 2017-09-15 DIAGNOSIS — Z7901 Long term (current) use of anticoagulants: Secondary | ICD-10-CM | POA: Diagnosis not present

## 2017-09-15 DIAGNOSIS — I4891 Unspecified atrial fibrillation: Secondary | ICD-10-CM | POA: Diagnosis not present

## 2017-10-13 DIAGNOSIS — I4891 Unspecified atrial fibrillation: Secondary | ICD-10-CM | POA: Diagnosis not present

## 2017-10-13 DIAGNOSIS — Z7901 Long term (current) use of anticoagulants: Secondary | ICD-10-CM | POA: Diagnosis not present

## 2017-10-28 DIAGNOSIS — Z7901 Long term (current) use of anticoagulants: Secondary | ICD-10-CM | POA: Diagnosis not present

## 2017-10-28 DIAGNOSIS — I4891 Unspecified atrial fibrillation: Secondary | ICD-10-CM | POA: Diagnosis not present

## 2017-11-10 DIAGNOSIS — I4891 Unspecified atrial fibrillation: Secondary | ICD-10-CM | POA: Diagnosis not present

## 2017-11-10 DIAGNOSIS — Z7901 Long term (current) use of anticoagulants: Secondary | ICD-10-CM | POA: Diagnosis not present

## 2017-12-08 DIAGNOSIS — Z7901 Long term (current) use of anticoagulants: Secondary | ICD-10-CM | POA: Diagnosis not present

## 2017-12-08 DIAGNOSIS — I4891 Unspecified atrial fibrillation: Secondary | ICD-10-CM | POA: Diagnosis not present

## 2018-01-10 DIAGNOSIS — Z7901 Long term (current) use of anticoagulants: Secondary | ICD-10-CM | POA: Diagnosis not present

## 2018-01-10 DIAGNOSIS — I4891 Unspecified atrial fibrillation: Secondary | ICD-10-CM | POA: Diagnosis not present

## 2018-02-07 DIAGNOSIS — Z7901 Long term (current) use of anticoagulants: Secondary | ICD-10-CM | POA: Diagnosis not present

## 2018-02-07 DIAGNOSIS — I4891 Unspecified atrial fibrillation: Secondary | ICD-10-CM | POA: Diagnosis not present

## 2018-02-21 DIAGNOSIS — E782 Mixed hyperlipidemia: Secondary | ICD-10-CM | POA: Diagnosis not present

## 2018-02-21 DIAGNOSIS — I10 Essential (primary) hypertension: Secondary | ICD-10-CM | POA: Diagnosis not present

## 2018-02-21 DIAGNOSIS — K219 Gastro-esophageal reflux disease without esophagitis: Secondary | ICD-10-CM | POA: Diagnosis not present

## 2018-02-21 DIAGNOSIS — M85851 Other specified disorders of bone density and structure, right thigh: Secondary | ICD-10-CM | POA: Diagnosis not present

## 2018-02-21 DIAGNOSIS — E1169 Type 2 diabetes mellitus with other specified complication: Secondary | ICD-10-CM | POA: Diagnosis not present

## 2018-02-21 DIAGNOSIS — Z0000 Encounter for general adult medical examination without abnormal findings: Secondary | ICD-10-CM | POA: Diagnosis not present

## 2018-02-21 DIAGNOSIS — Z1211 Encounter for screening for malignant neoplasm of colon: Secondary | ICD-10-CM | POA: Diagnosis not present

## 2018-02-21 DIAGNOSIS — Z1389 Encounter for screening for other disorder: Secondary | ICD-10-CM | POA: Diagnosis not present

## 2018-02-21 DIAGNOSIS — Z23 Encounter for immunization: Secondary | ICD-10-CM | POA: Diagnosis not present

## 2018-02-21 DIAGNOSIS — I4891 Unspecified atrial fibrillation: Secondary | ICD-10-CM | POA: Diagnosis not present

## 2018-02-23 ENCOUNTER — Other Ambulatory Visit: Payer: Self-pay | Admitting: Family Medicine

## 2018-02-23 DIAGNOSIS — Z1231 Encounter for screening mammogram for malignant neoplasm of breast: Secondary | ICD-10-CM

## 2018-03-21 DIAGNOSIS — Z7901 Long term (current) use of anticoagulants: Secondary | ICD-10-CM | POA: Diagnosis not present

## 2018-03-21 DIAGNOSIS — I4891 Unspecified atrial fibrillation: Secondary | ICD-10-CM | POA: Diagnosis not present

## 2018-04-18 DIAGNOSIS — I4891 Unspecified atrial fibrillation: Secondary | ICD-10-CM | POA: Diagnosis not present

## 2018-04-18 DIAGNOSIS — Z7901 Long term (current) use of anticoagulants: Secondary | ICD-10-CM | POA: Diagnosis not present

## 2018-04-20 ENCOUNTER — Emergency Department (HOSPITAL_COMMUNITY): Payer: Medicare HMO

## 2018-04-20 ENCOUNTER — Encounter (HOSPITAL_COMMUNITY): Payer: Self-pay | Admitting: Emergency Medicine

## 2018-04-20 ENCOUNTER — Inpatient Hospital Stay (HOSPITAL_COMMUNITY)
Admission: EM | Admit: 2018-04-20 | Discharge: 2018-05-05 | DRG: 511 | Disposition: A | Discharge status: Skilled Nursing Facility | Payer: Medicare HMO | Attending: Family Medicine | Admitting: Family Medicine

## 2018-04-20 ENCOUNTER — Inpatient Hospital Stay (HOSPITAL_COMMUNITY): Payer: Medicare HMO

## 2018-04-20 DIAGNOSIS — M25461 Effusion, right knee: Secondary | ICD-10-CM | POA: Diagnosis not present

## 2018-04-20 DIAGNOSIS — E871 Hypo-osmolality and hyponatremia: Secondary | ICD-10-CM | POA: Diagnosis not present

## 2018-04-20 DIAGNOSIS — K219 Gastro-esophageal reflux disease without esophagitis: Secondary | ICD-10-CM | POA: Diagnosis present

## 2018-04-20 DIAGNOSIS — R079 Chest pain, unspecified: Secondary | ICD-10-CM | POA: Diagnosis not present

## 2018-04-20 DIAGNOSIS — S2243XA Multiple fractures of ribs, bilateral, initial encounter for closed fracture: Secondary | ICD-10-CM | POA: Diagnosis present

## 2018-04-20 DIAGNOSIS — R4020 Unspecified coma: Secondary | ICD-10-CM | POA: Diagnosis not present

## 2018-04-20 DIAGNOSIS — R109 Unspecified abdominal pain: Secondary | ICD-10-CM | POA: Diagnosis not present

## 2018-04-20 DIAGNOSIS — R262 Difficulty in walking, not elsewhere classified: Secondary | ICD-10-CM | POA: Diagnosis not present

## 2018-04-20 DIAGNOSIS — J189 Pneumonia, unspecified organism: Secondary | ICD-10-CM

## 2018-04-20 DIAGNOSIS — S301XXA Contusion of abdominal wall, initial encounter: Secondary | ICD-10-CM | POA: Diagnosis not present

## 2018-04-20 DIAGNOSIS — M6281 Muscle weakness (generalized): Secondary | ICD-10-CM | POA: Diagnosis not present

## 2018-04-20 DIAGNOSIS — S52502A Unspecified fracture of the lower end of left radius, initial encounter for closed fracture: Secondary | ICD-10-CM | POA: Diagnosis not present

## 2018-04-20 DIAGNOSIS — I482 Chronic atrial fibrillation, unspecified: Secondary | ICD-10-CM | POA: Diagnosis not present

## 2018-04-20 DIAGNOSIS — R0602 Shortness of breath: Secondary | ICD-10-CM | POA: Diagnosis not present

## 2018-04-20 DIAGNOSIS — S2249XA Multiple fractures of ribs, unspecified side, initial encounter for closed fracture: Secondary | ICD-10-CM | POA: Diagnosis present

## 2018-04-20 DIAGNOSIS — Z885 Allergy status to narcotic agent status: Secondary | ICD-10-CM | POA: Diagnosis not present

## 2018-04-20 DIAGNOSIS — Z6841 Body Mass Index (BMI) 40.0 and over, adult: Secondary | ICD-10-CM | POA: Diagnosis not present

## 2018-04-20 DIAGNOSIS — Z888 Allergy status to other drugs, medicaments and biological substances status: Secondary | ICD-10-CM | POA: Diagnosis not present

## 2018-04-20 DIAGNOSIS — S5292XA Unspecified fracture of left forearm, initial encounter for closed fracture: Secondary | ICD-10-CM | POA: Diagnosis present

## 2018-04-20 DIAGNOSIS — G8918 Other acute postprocedural pain: Secondary | ICD-10-CM

## 2018-04-20 DIAGNOSIS — S52502D Unspecified fracture of the lower end of left radius, subsequent encounter for closed fracture with routine healing: Secondary | ICD-10-CM | POA: Diagnosis not present

## 2018-04-20 DIAGNOSIS — S52302D Unspecified fracture of shaft of left radius, subsequent encounter for closed fracture with routine healing: Secondary | ICD-10-CM | POA: Diagnosis not present

## 2018-04-20 DIAGNOSIS — Y92410 Unspecified street and highway as the place of occurrence of the external cause: Secondary | ICD-10-CM

## 2018-04-20 DIAGNOSIS — R0789 Other chest pain: Secondary | ICD-10-CM | POA: Diagnosis not present

## 2018-04-20 DIAGNOSIS — S52202A Unspecified fracture of shaft of left ulna, initial encounter for closed fracture: Secondary | ICD-10-CM | POA: Diagnosis not present

## 2018-04-20 DIAGNOSIS — Z79899 Other long term (current) drug therapy: Secondary | ICD-10-CM | POA: Diagnosis not present

## 2018-04-20 DIAGNOSIS — S2239XA Fracture of one rib, unspecified side, initial encounter for closed fracture: Secondary | ICD-10-CM

## 2018-04-20 DIAGNOSIS — I10 Essential (primary) hypertension: Secondary | ICD-10-CM | POA: Diagnosis present

## 2018-04-20 DIAGNOSIS — S0990XA Unspecified injury of head, initial encounter: Secondary | ICD-10-CM | POA: Diagnosis not present

## 2018-04-20 DIAGNOSIS — S62102A Fracture of unspecified carpal bone, left wrist, initial encounter for closed fracture: Secondary | ICD-10-CM | POA: Diagnosis not present

## 2018-04-20 DIAGNOSIS — S2241XA Multiple fractures of ribs, right side, initial encounter for closed fracture: Secondary | ICD-10-CM | POA: Diagnosis not present

## 2018-04-20 DIAGNOSIS — D72829 Elevated white blood cell count, unspecified: Secondary | ICD-10-CM

## 2018-04-20 DIAGNOSIS — S199XXA Unspecified injury of neck, initial encounter: Secondary | ICD-10-CM | POA: Diagnosis not present

## 2018-04-20 DIAGNOSIS — S52572A Other intraarticular fracture of lower end of left radius, initial encounter for closed fracture: Principal | ICD-10-CM | POA: Diagnosis present

## 2018-04-20 DIAGNOSIS — T148XXA Other injury of unspecified body region, initial encounter: Secondary | ICD-10-CM

## 2018-04-20 DIAGNOSIS — J90 Pleural effusion, not elsewhere classified: Secondary | ICD-10-CM | POA: Diagnosis not present

## 2018-04-20 DIAGNOSIS — M25572 Pain in left ankle and joints of left foot: Secondary | ICD-10-CM | POA: Diagnosis not present

## 2018-04-20 DIAGNOSIS — E8770 Fluid overload, unspecified: Secondary | ICD-10-CM | POA: Diagnosis not present

## 2018-04-20 DIAGNOSIS — Z7901 Long term (current) use of anticoagulants: Secondary | ICD-10-CM | POA: Diagnosis not present

## 2018-04-20 DIAGNOSIS — S82002A Unspecified fracture of left patella, initial encounter for closed fracture: Secondary | ICD-10-CM | POA: Diagnosis not present

## 2018-04-20 DIAGNOSIS — K5900 Constipation, unspecified: Secondary | ICD-10-CM | POA: Diagnosis not present

## 2018-04-20 DIAGNOSIS — I4891 Unspecified atrial fibrillation: Secondary | ICD-10-CM | POA: Diagnosis not present

## 2018-04-20 DIAGNOSIS — S82001D Unspecified fracture of right patella, subsequent encounter for closed fracture with routine healing: Secondary | ICD-10-CM | POA: Diagnosis not present

## 2018-04-20 DIAGNOSIS — S2249XD Multiple fractures of ribs, unspecified side, subsequent encounter for fracture with routine healing: Secondary | ICD-10-CM | POA: Diagnosis not present

## 2018-04-20 DIAGNOSIS — R0902 Hypoxemia: Secondary | ICD-10-CM | POA: Diagnosis not present

## 2018-04-20 DIAGNOSIS — D62 Acute posthemorrhagic anemia: Secondary | ICD-10-CM | POA: Diagnosis not present

## 2018-04-20 DIAGNOSIS — S99912A Unspecified injury of left ankle, initial encounter: Secondary | ICD-10-CM | POA: Diagnosis not present

## 2018-04-20 DIAGNOSIS — S82024A Nondisplaced longitudinal fracture of right patella, initial encounter for closed fracture: Secondary | ICD-10-CM | POA: Diagnosis not present

## 2018-04-20 DIAGNOSIS — S3991XA Unspecified injury of abdomen, initial encounter: Secondary | ICD-10-CM | POA: Diagnosis not present

## 2018-04-20 DIAGNOSIS — E785 Hyperlipidemia, unspecified: Secondary | ICD-10-CM | POA: Diagnosis not present

## 2018-04-20 DIAGNOSIS — R1084 Generalized abdominal pain: Secondary | ICD-10-CM | POA: Diagnosis not present

## 2018-04-20 DIAGNOSIS — G44309 Post-traumatic headache, unspecified, not intractable: Secondary | ICD-10-CM | POA: Diagnosis not present

## 2018-04-20 DIAGNOSIS — R279 Unspecified lack of coordination: Secondary | ICD-10-CM | POA: Diagnosis not present

## 2018-04-20 DIAGNOSIS — S2231XA Fracture of one rib, right side, initial encounter for closed fracture: Secondary | ICD-10-CM | POA: Diagnosis not present

## 2018-04-20 DIAGNOSIS — D649 Anemia, unspecified: Secondary | ICD-10-CM | POA: Diagnosis not present

## 2018-04-20 DIAGNOSIS — S52562A Barton's fracture of left radius, initial encounter for closed fracture: Secondary | ICD-10-CM | POA: Diagnosis not present

## 2018-04-20 DIAGNOSIS — Z743 Need for continuous supervision: Secondary | ICD-10-CM | POA: Diagnosis not present

## 2018-04-20 DIAGNOSIS — M549 Dorsalgia, unspecified: Secondary | ICD-10-CM | POA: Diagnosis not present

## 2018-04-20 DIAGNOSIS — D500 Iron deficiency anemia secondary to blood loss (chronic): Secondary | ICD-10-CM | POA: Diagnosis not present

## 2018-04-20 DIAGNOSIS — Z419 Encounter for procedure for purposes other than remedying health state, unspecified: Secondary | ICD-10-CM

## 2018-04-20 LAB — COMPREHENSIVE METABOLIC PANEL
ALT: 27 U/L (ref 0–44)
AST: 42 U/L — AB (ref 15–41)
Albumin: 3.7 g/dL (ref 3.5–5.0)
Alkaline Phosphatase: 88 U/L (ref 38–126)
Anion gap: 11 (ref 5–15)
BILIRUBIN TOTAL: 1.1 mg/dL (ref 0.3–1.2)
BUN: 15 mg/dL (ref 8–23)
CO2: 21 mmol/L — ABNORMAL LOW (ref 22–32)
Calcium: 8.9 mg/dL (ref 8.9–10.3)
Chloride: 106 mmol/L (ref 98–111)
Creatinine, Ser: 0.79 mg/dL (ref 0.44–1.00)
GFR calc Af Amer: >60 mL/min (ref >60)
Glucose, Bld: 130 mg/dL — ABNORMAL HIGH (ref 70–99)
Potassium: 4 mmol/L (ref 3.5–5.1)
Sodium: 138 mmol/L (ref 135–145)
TOTAL PROTEIN: 7.1 g/dL (ref 6.5–8.1)

## 2018-04-20 LAB — I-STAT CG4 LACTIC ACID, ED: LACTIC ACID, VENOUS: 1.8 mmol/L (ref 0.5–1.9)

## 2018-04-20 LAB — PROTIME-INR
INR: 1.63
PROTHROMBIN TIME: 19.2 s — AB (ref 11.4–15.2)

## 2018-04-20 LAB — CBC
HCT: 40.7 % (ref 36.0–46.0)
Hemoglobin: 13.4 g/dL (ref 12.0–15.0)
MCH: 30.2 pg (ref 26.0–34.0)
MCHC: 32.9 g/dL (ref 30.0–36.0)
MCV: 91.7 fL (ref 78.0–100.0)
Platelets: 245 10*3/uL (ref 150–400)
RBC: 4.44 MIL/uL (ref 3.87–5.11)
RDW: 13.7 % (ref 11.5–15.5)
WBC: 13.6 10*3/uL — AB (ref 4.0–10.5)

## 2018-04-20 LAB — ETHANOL: Alcohol, Ethyl (B): <10 mg/dL (ref <10)

## 2018-04-20 MED ORDER — DILTIAZEM HCL ER COATED BEADS 180 MG PO CP24
180.0000 mg | ORAL_CAPSULE | Freq: Every day | ORAL | Status: DC
  Administered 2018-04-21: 180 mg via ORAL
  Filled 2018-04-20: qty 1

## 2018-04-20 MED ORDER — MORPHINE SULFATE (PF) 4 MG/ML IV SOLN
4.0000 mg | Freq: Once | INTRAVENOUS | Status: AC
  Administered 2018-04-20: 4 mg via INTRAVENOUS
  Filled 2018-04-20: qty 1

## 2018-04-20 MED ORDER — SODIUM CHLORIDE 0.9 % IV BOLUS
1000.0000 mL | Freq: Once | INTRAVENOUS | Status: AC
  Administered 2018-04-20: 1000 mL via INTRAVENOUS

## 2018-04-20 MED ORDER — IOHEXOL 300 MG/ML  SOLN
100.0000 mL | Freq: Once | INTRAMUSCULAR | Status: AC | PRN
  Administered 2018-04-20: 100 mL via INTRAVENOUS

## 2018-04-20 MED ORDER — OXYCODONE-ACETAMINOPHEN 5-325 MG PO TABS
2.0000 | ORAL_TABLET | Freq: Once | ORAL | Status: DC
  Filled 2018-04-20: qty 2

## 2018-04-20 MED ORDER — MAGNESIUM OXIDE 400 (241.3 MG) MG PO TABS
200.0000 mg | ORAL_TABLET | Freq: Every day | ORAL | Status: DC
  Administered 2018-04-21 – 2018-04-24 (×4): 200 mg via ORAL
  Filled 2018-04-20 (×6): qty 1

## 2018-04-20 MED ORDER — ACETAMINOPHEN 325 MG PO TABS
650.0000 mg | ORAL_TABLET | ORAL | Status: DC | PRN
  Administered 2018-04-21 – 2018-04-23 (×5): 650 mg via ORAL
  Filled 2018-04-20 (×5): qty 2

## 2018-04-20 MED ORDER — ETOMIDATE 2 MG/ML IV SOLN
10.0000 mg | Freq: Once | INTRAVENOUS | Status: AC
  Administered 2018-04-20: 10 mg via INTRAVENOUS
  Filled 2018-04-20: qty 10

## 2018-04-20 MED ORDER — ONDANSETRON HCL 4 MG/2ML IJ SOLN
4.0000 mg | Freq: Four times a day (QID) | INTRAMUSCULAR | Status: DC | PRN
  Administered 2018-04-25 – 2018-05-01 (×3): 4 mg via INTRAVENOUS
  Filled 2018-04-20 (×3): qty 2

## 2018-04-20 MED ORDER — CALCIUM CARBONATE-VITAMIN D 500-200 MG-UNIT PO TABS
1.0000 | ORAL_TABLET | Freq: Every day | ORAL | Status: DC
  Administered 2018-04-21 – 2018-04-24 (×4): 1 via ORAL
  Filled 2018-04-20 (×7): qty 1

## 2018-04-20 MED ORDER — DILTIAZEM HCL 25 MG/5ML IV SOLN: 25.0000 mg | Freq: Once | INTRAVENOUS | Status: DC

## 2018-04-20 MED ORDER — VITAMIN D 1000 UNITS PO TABS
1000.0000 [IU] | ORAL_TABLET | Freq: Every day | ORAL | Status: DC
Start: 2018-04-21 — End: 2018-05-06
  Administered 2018-04-21 – 2018-04-24 (×4): 1000 [IU] via ORAL
  Filled 2018-04-20 (×7): qty 1

## 2018-04-20 MED ORDER — HEPARIN (PORCINE) IN NACL 100-0.45 UNIT/ML-% IJ SOLN
1400.0000 [IU]/h | INTRAMUSCULAR | Status: DC
  Administered 2018-04-21: 1400 [IU]/h via INTRAVENOUS
  Filled 2018-04-20: qty 250

## 2018-04-20 MED ORDER — DILTIAZEM HCL 60 MG PO TABS
60.0000 mg | ORAL_TABLET | Freq: Once | ORAL | Status: AC
  Administered 2018-04-20: 60 mg via ORAL
  Filled 2018-04-20: qty 1

## 2018-04-20 MED ORDER — ONDANSETRON HCL 4 MG/2ML IJ SOLN
4.0000 mg | Freq: Once | INTRAMUSCULAR | Status: AC
  Administered 2018-04-20: 4 mg via INTRAVENOUS
  Filled 2018-04-20: qty 2

## 2018-04-20 MED ORDER — DILTIAZEM HCL-DEXTROSE 100-5 MG/100ML-% IV SOLN (PREMIX)
5.0000 mg/h | INTRAVENOUS | Status: DC
  Administered 2018-04-21: 5 mg/h via INTRAVENOUS
  Administered 2018-04-21: 7.5 mg/h via INTRAVENOUS
  Filled 2018-04-20 (×3): qty 100

## 2018-04-20 MED ORDER — MIDAZOLAM HCL 2 MG/2ML IJ SOLN
2.0000 mg | Freq: Once | INTRAMUSCULAR | Status: AC
  Administered 2018-04-20: 1 mg via INTRAVENOUS
  Filled 2018-04-20: qty 2

## 2018-04-20 MED ORDER — PRAVASTATIN SODIUM 40 MG PO TABS
40.0000 mg | ORAL_TABLET | Freq: Every day | ORAL | Status: DC
  Administered 2018-04-21 – 2018-05-01 (×11): 40 mg via ORAL
  Filled 2018-04-20 (×11): qty 1
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
## 2018-04-20 NOTE — Progress Notes (Signed)
Discussed case with ER provider.  Patient with closed left distal radius comminuted fracture with significant displacement, median nerve is intact.  She also has multiple other rib fractures, questionable distal tibia fracture although it looks like an avulsion, and is likely old, as there does not seem to be much tenderness in that location.  Plan for trauma admission, observation overnight, closed reduction in the emergency room by the emergency room providers, and likely definitive surgical fixation for her distal radius tomorrow.  NPO after midnight.  Full consult to follow.   Johnny Bridge, MD
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
## 2018-04-21 ENCOUNTER — Inpatient Hospital Stay (HOSPITAL_COMMUNITY): Payer: Medicare HMO

## 2018-04-21 ENCOUNTER — Inpatient Hospital Stay (HOSPITAL_COMMUNITY): Payer: Medicare HMO | Admitting: Anesthesiology

## 2018-04-21 ENCOUNTER — Encounter (HOSPITAL_COMMUNITY)
Admission: EM | Disposition: A | Discharge status: Skilled Nursing Facility | Payer: Self-pay | Source: Home / Self Care | Attending: Family Medicine

## 2018-04-21 ENCOUNTER — Other Ambulatory Visit: Payer: Self-pay

## 2018-04-21 ENCOUNTER — Encounter (HOSPITAL_COMMUNITY): Payer: Self-pay

## 2018-04-21 DIAGNOSIS — T148XXA Other injury of unspecified body region, initial encounter: Secondary | ICD-10-CM

## 2018-04-21 DIAGNOSIS — S2249XD Multiple fractures of ribs, unspecified side, subsequent encounter for fracture with routine healing: Secondary | ICD-10-CM

## 2018-04-21 DIAGNOSIS — I4891 Unspecified atrial fibrillation: Secondary | ICD-10-CM

## 2018-04-21 HISTORY — PX: OPEN REDUCTION INTERNAL FIXATION (ORIF) DISTAL RADIAL FRACTURE: SHX5989

## 2018-04-21 LAB — URINALYSIS, ROUTINE W REFLEX MICROSCOPIC
Bilirubin Urine: NEGATIVE
Glucose, UA: NEGATIVE mg/dL
Ketones, ur: 5 mg/dL — AB
Nitrite: NEGATIVE
Protein, ur: NEGATIVE mg/dL
SPECIFIC GRAVITY, URINE: 1.029 (ref 1.005–1.030)
pH: 5 (ref 5.0–8.0)

## 2018-04-21 LAB — CBC
HEMATOCRIT: 35.8 % — AB (ref 36.0–46.0)
Hemoglobin: 11.8 g/dL — ABNORMAL LOW (ref 12.0–15.0)
MCH: 29.7 pg (ref 26.0–34.0)
MCHC: 33 g/dL (ref 30.0–36.0)
MCV: 90.2 fL (ref 78.0–100.0)
Platelets: 224 10*3/uL (ref 150–400)
RBC: 3.97 MIL/uL (ref 3.87–5.11)
RDW: 13.8 % (ref 11.5–15.5)
WBC: 7.8 10*3/uL (ref 4.0–10.5)

## 2018-04-21 LAB — MRSA PCR SCREENING: MRSA BY PCR: NEGATIVE

## 2018-04-21 LAB — BASIC METABOLIC PANEL
Anion gap: 10 (ref 5–15)
BUN: 10 mg/dL (ref 8–23)
CHLORIDE: 104 mmol/L (ref 98–111)
CO2: 23 mmol/L (ref 22–32)
Calcium: 8.4 mg/dL — ABNORMAL LOW (ref 8.9–10.3)
Creatinine, Ser: 0.72 mg/dL (ref 0.44–1.00)
GFR calc non Af Amer: >60 mL/min (ref >60)
Glucose, Bld: 192 mg/dL — ABNORMAL HIGH (ref 70–99)
POTASSIUM: 4.1 mmol/L (ref 3.5–5.1)
Sodium: 137 mmol/L (ref 135–145)

## 2018-04-21 LAB — ECHOCARDIOGRAM COMPLETE
Height: 63 in
WEIGHTICAEL: 4419.78 [oz_av]

## 2018-04-21 LAB — HIV ANTIBODY (ROUTINE TESTING W REFLEX): HIV Screen 4th Generation wRfx: NONREACTIVE

## 2018-04-21 LAB — HEPARIN LEVEL (UNFRACTIONATED): HEPARIN UNFRACTIONATED: 1.04 [IU]/mL — AB (ref 0.30–0.70)

## 2018-04-21 SURGERY — OPEN REDUCTION INTERNAL FIXATION (ORIF) DISTAL RADIUS FRACTURE
Anesthesia: General | Laterality: Left

## 2018-04-21 MED ORDER — SUCCINYLCHOLINE CHLORIDE 20 MG/ML IJ SOLN
INTRAMUSCULAR | Status: DC | PRN
  Administered 2018-04-21: 120 mg via INTRAVENOUS

## 2018-04-21 MED ORDER — DEXAMETHASONE SODIUM PHOSPHATE 10 MG/ML IJ SOLN
INTRAMUSCULAR | Status: DC | PRN
  Administered 2018-04-21: 10 mg via INTRAVENOUS

## 2018-04-21 MED ORDER — METHOCARBAMOL 500 MG PO TABS: 500.0000 mg | ORAL_TABLET | Freq: Three times a day (TID) | ORAL | Status: DC | PRN

## 2018-04-21 MED ORDER — 0.9 % SODIUM CHLORIDE (POUR BTL) OPTIME
TOPICAL | Status: DC | PRN
  Administered 2018-04-21: 1000 mL

## 2018-04-21 MED ORDER — LACTATED RINGERS IV SOLN
INTRAVENOUS | Status: DC
  Administered 2018-04-21 (×2): via INTRAVENOUS

## 2018-04-21 MED ORDER — PROMETHAZINE HCL 25 MG/ML IJ SOLN
6.2500 mg | INTRAMUSCULAR | Status: DC | PRN
  Administered 2018-04-21: 6.25 mg via INTRAVENOUS

## 2018-04-21 MED ORDER — MORPHINE SULFATE (PF) 2 MG/ML IV SOLN
1.0000 mg | INTRAVENOUS | Status: DC | PRN
  Administered 2018-04-21: 1 mg via INTRAVENOUS
  Administered 2018-04-22: 2 mg via INTRAVENOUS
  Filled 2018-04-21 (×2): qty 1

## 2018-04-21 MED ORDER — ROCURONIUM BROMIDE 100 MG/10ML IV SOLN
INTRAVENOUS | Status: DC | PRN
  Administered 2018-04-21: 50 mg via INTRAVENOUS

## 2018-04-21 MED ORDER — SUGAMMADEX SODIUM 200 MG/2ML IV SOLN
INTRAVENOUS | Status: AC
  Filled 2018-04-21: qty 4

## 2018-04-21 MED ORDER — FENTANYL CITRATE (PF) 100 MCG/2ML IJ SOLN
INTRAMUSCULAR | Status: DC | PRN
  Administered 2018-04-21: 50 ug via INTRAVENOUS
  Administered 2018-04-21 (×2): 100 ug via INTRAVENOUS

## 2018-04-21 MED ORDER — DEXTROSE 5 % IV SOLN
INTRAVENOUS | Status: DC | PRN
  Administered 2018-04-21: 21:00:00 via INTRAVENOUS
  Administered 2018-04-21: 7.5 mg/h via INTRAVENOUS

## 2018-04-21 MED ORDER — FENTANYL CITRATE (PF) 250 MCG/5ML IJ SOLN
INTRAMUSCULAR | Status: AC
  Filled 2018-04-21: qty 5

## 2018-04-21 MED ORDER — HYDROMORPHONE HCL 1 MG/ML IJ SOLN
INTRAMUSCULAR | Status: AC
  Filled 2018-04-21: qty 1

## 2018-04-21 MED ORDER — DILTIAZEM HCL ER COATED BEADS 180 MG PO CP24
180.0000 mg | ORAL_CAPSULE | Freq: Every day | ORAL | Status: DC
  Administered 2018-04-22 – 2018-04-29 (×8): 180 mg via ORAL
  Filled 2018-04-21 (×8): qty 1

## 2018-04-21 MED ORDER — SUGAMMADEX SODIUM 200 MG/2ML IV SOLN
INTRAVENOUS | Status: DC | PRN
  Administered 2018-04-21: 250 mg via INTRAVENOUS

## 2018-04-21 MED ORDER — LIDOCAINE HCL (CARDIAC) PF 100 MG/5ML IV SOSY
PREFILLED_SYRINGE | INTRAVENOUS | Status: DC | PRN
  Administered 2018-04-21: 30 mg via INTRAVENOUS

## 2018-04-21 MED ORDER — POVIDONE-IODINE 10 % EX SWAB: 2.0000 "application " | Freq: Once | CUTANEOUS | Status: DC

## 2018-04-21 MED ORDER — OXYCODONE HCL 5 MG PO TABS
5.0000 mg | ORAL_TABLET | Freq: Four times a day (QID) | ORAL | Status: DC | PRN
  Administered 2018-04-21 – 2018-04-22 (×3): 5 mg via ORAL
  Filled 2018-04-21 (×3): qty 1

## 2018-04-21 MED ORDER — MIDAZOLAM HCL 2 MG/2ML IJ SOLN
INTRAMUSCULAR | Status: AC
  Filled 2018-04-21: qty 2

## 2018-04-21 MED ORDER — DEXTROSE 5 % IV SOLN
3.0000 g | INTRAVENOUS | Status: DC
  Filled 2018-04-21 (×2): qty 3000

## 2018-04-21 MED ORDER — PERFLUTREN LIPID MICROSPHERE
1.0000 mL | INTRAVENOUS | Status: AC | PRN
  Administered 2018-04-21: 2 mL via INTRAVENOUS
  Filled 2018-04-21: qty 10

## 2018-04-21 MED ORDER — PROMETHAZINE HCL 25 MG/ML IJ SOLN
INTRAMUSCULAR | Status: AC
  Filled 2018-04-21: qty 1

## 2018-04-21 MED ORDER — CHLORHEXIDINE GLUCONATE 4 % EX LIQD
60.0000 mL | Freq: Once | CUTANEOUS | Status: AC
  Administered 2018-04-21: 4 via TOPICAL
  Filled 2018-04-21: qty 60

## 2018-04-21 MED ORDER — HEPARIN (PORCINE) IN NACL 100-0.45 UNIT/ML-% IJ SOLN
1200.0000 [IU]/h | INTRAMUSCULAR | Status: DC
  Administered 2018-04-21: 1200 [IU]/h via INTRAVENOUS
  Filled 2018-04-21: qty 250

## 2018-04-21 MED ORDER — LACTATED RINGERS IV SOLN: INTRAVENOUS | Status: DC

## 2018-04-21 MED ORDER — FENTANYL CITRATE (PF) 100 MCG/2ML IJ SOLN
INTRAMUSCULAR | Status: AC
  Filled 2018-04-21: qty 2

## 2018-04-21 MED ORDER — METHOCARBAMOL 500 MG PO TABS
500.0000 mg | ORAL_TABLET | Freq: Three times a day (TID) | ORAL | Status: DC | PRN
Start: 2018-04-21 — End: 2018-04-23
  Administered 2018-04-21 – 2018-04-22 (×4): 500 mg via ORAL
  Filled 2018-04-21 (×5): qty 1

## 2018-04-21 MED ORDER — HYDROMORPHONE HCL 1 MG/ML IJ SOLN
0.2500 mg | INTRAMUSCULAR | Status: DC | PRN
  Administered 2018-04-21 (×2): 0.5 mg via INTRAVENOUS

## 2018-04-21 MED ORDER — ONDANSETRON HCL 4 MG/2ML IJ SOLN
INTRAMUSCULAR | Status: DC | PRN
  Administered 2018-04-21: 4 mg via INTRAVENOUS

## 2018-04-21 MED ORDER — PROPOFOL 10 MG/ML IV BOLUS
INTRAVENOUS | Status: DC | PRN
  Administered 2018-04-21: 150 mg via INTRAVENOUS

## 2018-04-21 SURGICAL SUPPLY — 61 items
BANDAGE ACE 3X5.8 VEL STRL LF (GAUZE/BANDAGES/DRESSINGS) ×2 IMPLANT
BANDAGE ACE 4X5 VEL STRL LF (GAUZE/BANDAGES/DRESSINGS) ×2 IMPLANT
BIT DRILL 2.2 SS TIBIAL (BIT) ×2 IMPLANT
BLADE CLIPPER SURG (BLADE) ×2 IMPLANT
BNDG CMPR 9X4 STRL LF SNTH (GAUZE/BANDAGES/DRESSINGS) ×1
BNDG ESMARK 4X9 LF (GAUZE/BANDAGES/DRESSINGS) ×2 IMPLANT
BRUSH SCRUB SURG 4.25 DISP (MISCELLANEOUS) ×4 IMPLANT
COVER SURGICAL LIGHT HANDLE (MISCELLANEOUS) ×2 IMPLANT
DECANTER SPIKE VIAL GLASS SM (MISCELLANEOUS) IMPLANT
DRAPE C-ARM 42X72 X-RAY (DRAPES) ×2 IMPLANT
DRSG EMULSION OIL 3X3 NADH (GAUZE/BANDAGES/DRESSINGS) ×2 IMPLANT
ELECT REM PT RETURN 9FT ADLT (ELECTROSURGICAL) ×2
ELECTRODE REM PT RTRN 9FT ADLT (ELECTROSURGICAL) ×1 IMPLANT
GAUZE SPONGE 4X4 12PLY STRL (GAUZE/BANDAGES/DRESSINGS) ×2 IMPLANT
GLOVE BIO SURGEON STRL SZ7.5 (GLOVE) IMPLANT
GLOVE BIO SURGEON STRL SZ8 (GLOVE) ×4 IMPLANT
GLOVE BIOGEL PI IND STRL 7.5 (GLOVE) IMPLANT
GLOVE BIOGEL PI IND STRL 8 (GLOVE) ×2 IMPLANT
GLOVE BIOGEL PI INDICATOR 7.5 (GLOVE)
GLOVE BIOGEL PI INDICATOR 8 (GLOVE) ×2
GOWN STRL REUS W/ TWL LRG LVL3 (GOWN DISPOSABLE) ×1 IMPLANT
GOWN STRL REUS W/ TWL XL LVL3 (GOWN DISPOSABLE) ×1 IMPLANT
GOWN STRL REUS W/TWL LRG LVL3 (GOWN DISPOSABLE) ×2
GOWN STRL REUS W/TWL XL LVL3 (GOWN DISPOSABLE) ×2
K-WIRE 1.6 (WIRE) ×8
K-WIRE FX5X1.6XNS BN SS (WIRE) ×4
KIT BASIN OR (CUSTOM PROCEDURE TRAY) ×2 IMPLANT
KIT TURNOVER KIT B (KITS) ×2 IMPLANT
KWIRE FX5X1.6XNS BN SS (WIRE) ×4 IMPLANT
NEEDLE HYPO 25GX1X1/2 BEV (NEEDLE) IMPLANT
NS IRRIG 1000ML POUR BTL (IV SOLUTION) ×2 IMPLANT
PACK ORTHO EXTREMITY (CUSTOM PROCEDURE TRAY) ×2 IMPLANT
PAD ARMBOARD 7.5X6 YLW CONV (MISCELLANEOUS) ×2 IMPLANT
PAD CAST 3X4 CTTN HI CHSV (CAST SUPPLIES) ×1 IMPLANT
PAD CAST 4YDX4 CTTN HI CHSV (CAST SUPPLIES) ×1 IMPLANT
PADDING CAST COTTON 3X4 STRL (CAST SUPPLIES) ×2
PADDING CAST COTTON 4X4 STRL (CAST SUPPLIES) ×2
PEG LOCKING SMOOTH 2.2X20 (Screw) ×2 IMPLANT
PEG LOCKING SMOOTH 2.2X22 (Screw) ×2 IMPLANT
PEG LOCKING SMOOTH 2.2X24 (Peg) ×6 IMPLANT
PLATE STD DVR LEFT (Plate) ×2 IMPLANT
PLATE STD DVR LT 24X55 (Plate) ×1 IMPLANT
SCREW LOCK 12X2.7X 3 LD (Screw) ×1 IMPLANT
SCREW LOCK 14X2.7X 3 LD TPR (Screw) ×1 IMPLANT
SCREW LOCK 22X2.7X 3 LD TPR (Screw) ×2 IMPLANT
SCREW LOCKING 2.7X12MM (Screw) ×2 IMPLANT
SCREW LOCKING 2.7X13MM (Screw) ×2 IMPLANT
SCREW LOCKING 2.7X14 (Screw) ×2 IMPLANT
SCREW LOCKING 2.7X22MM (Screw) ×4 IMPLANT
SPLINT PLASTER EXTRA FAST 3X15 (CAST SUPPLIES) ×1
SPLINT PLASTER GYPS XFAST 3X15 (CAST SUPPLIES) ×1 IMPLANT
SUT ETHILON 3 0 PS 1 (SUTURE) ×2 IMPLANT
SUT VIC AB 0 CT1 27 (SUTURE) ×2
SUT VIC AB 0 CT1 27XBRD ANBCTR (SUTURE) ×1 IMPLANT
SUT VIC AB 2-0 CT1 27 (SUTURE)
SUT VIC AB 2-0 CT1 TAPERPNT 27 (SUTURE) IMPLANT
SYR CONTROL 10ML LL (SYRINGE) IMPLANT
TOWEL OR 17X24 6PK STRL BLUE (TOWEL DISPOSABLE) ×2 IMPLANT
TOWEL OR 17X26 10 PK STRL BLUE (TOWEL DISPOSABLE) ×2 IMPLANT
TUBE CONNECTING 12X1/4 (SUCTIONS) ×2 IMPLANT
UNDERPAD 30X30 (UNDERPADS AND DIAPERS) ×2 IMPLANT

## 2018-04-21 NOTE — Anesthesia Preprocedure Evaluation (Addendum)
Anesthesia Evaluation  Patient identified by MRN, date of birth, ID band Patient awake    Reviewed: Allergy & Precautions, NPO status , Patient's Chart, lab work & pertinent test results  Airway Mallampati: I  TM Distance: >3 FB Neck ROM: Full    Dental  (+) Teeth Intact, Dental Advisory Given   Pulmonary neg pulmonary ROS,    breath sounds clear to auscultation       Cardiovascular hypertension, + dysrhythmias Atrial Fibrillation  Rhythm:Irregular Rate:Normal     Neuro/Psych  Neuromuscular disease negative psych ROS   GI/Hepatic Neg liver ROS, hiatal hernia,   Endo/Other  negative endocrine ROS  Renal/GU      Musculoskeletal  (+) Arthritis ,   Abdominal (+) + obese,   Peds  Hematology negative hematology ROS (+)   Anesthesia Other Findings   Reproductive/Obstetrics                            Anesthesia Physical Anesthesia Plan  ASA: III and emergent  Anesthesia Plan: General   Post-op Pain Management:    Induction: Intravenous, Cricoid pressure planned and Rapid sequence  PONV Risk Score and Plan: 4 or greater and Ondansetron, Dexamethasone and Midazolam  Airway Management Planned: Oral ETT  Additional Equipment: None  Intra-op Plan:   Post-operative Plan: Extubation in OR  Informed Consent: I have reviewed the patients History and Physical, chart, labs and discussed the procedure including the risks, benefits and alternatives for the proposed anesthesia with the patient or authorized representative who has indicated his/her understanding and acceptance.   Dental advisory given  Plan Discussed with: CRNA  Anesthesia Plan Comments: (Surgeon declined block)        Anesthesia Quick Evaluation

## 2018-04-21 NOTE — Transfer of Care (Signed)
Immediate Anesthesia Transfer of Care Note  Patient: Nancy Thomas  Procedure(s) Performed: OPEN REDUCTION INTERNAL FIXATION (ORIF) LEFT  DISTAL RADIAL FRACTURE (Left )  Patient Location: PACU  Anesthesia Type:General  Level of Consciousness: awake, alert  and oriented  Airway & Oxygen Therapy: Patient connected to nasal cannula oxygen  Post-op Assessment: Report given to RN and Post -op Vital signs reviewed and stable  Post vital signs: Reviewed and stable  Last Vitals:  Vitals Value Taken Time  BP 161/82 04/21/2018  9:43 PM  Temp    Pulse 96 04/21/2018  9:45 PM  Resp 20 04/21/2018  9:45 PM  SpO2 99 % 04/21/2018  9:45 PM  Vitals shown include unvalidated device data.  Last Pain:  Vitals:   04/21/18 1608  TempSrc:   PainSc: 4       Patients Stated Pain Goal: 4 (01/74/94 4967)  Complications: No apparent anesthesia complications
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
## 2018-04-21 NOTE — Anesthesia Postprocedure Evaluation (Signed)
Anesthesia Post Note  Patient: Nancy Thomas  Procedure(s) Performed: OPEN REDUCTION INTERNAL FIXATION (ORIF) LEFT  DISTAL RADIAL FRACTURE (Left )     Patient location during evaluation: PACU Anesthesia Type: General Level of consciousness: awake and alert Pain management: pain level controlled Vital Signs Assessment: post-procedure vital signs reviewed and stable Respiratory status: spontaneous breathing, nonlabored ventilation, respiratory function stable and patient connected to nasal cannula oxygen Cardiovascular status: blood pressure returned to baseline and stable Postop Assessment: no apparent nausea or vomiting Anesthetic complications: no    Last Vitals:  Vitals:   04/21/18 2230 04/21/18 2305  BP: (!) 172/75 (!) 168/75  Pulse: 100 82  Resp: 20 (!) 22  Temp: 37.3 C 36.8 C  SpO2: 96% 90%    Last Pain:  Vitals:   04/21/18 2305  TempSrc: Oral  PainSc:                  [name]

## 2018-04-21 NOTE — Anesthesia Procedure Notes (Signed)
Procedure Name: Intubation Date/Time: 04/21/2018 7:41 PM Performed by: Eligha Bridegroom, CRNA Pre-anesthesia Checklist: Patient identified, Emergency Drugs available, Suction available, Patient being monitored and Timeout performed Patient Re-evaluated:Patient Re-evaluated prior to induction Preoxygenation: Pre-oxygenation with 100% oxygen Induction Type: Rapid sequence Grade View: Grade I Tube type: Oral Tube size: 7.0 mm Number of attempts: 1 Airway Equipment and Method: Stylet Placement Confirmation: ETT inserted through vocal cords under direct vision,  positive ETCO2 and breath sounds checked- equal and bilateral Secured at: 21 cm Tube secured with: Tape Dental Injury: Teeth and Oropharynx as per pre-operative assessment
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
## 2018-04-21 NOTE — Progress Notes (Signed)
  Echocardiogram 2D Echocardiogram has been performed.  Bobbye Charleston 04/21/2018, 3:18 PM
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
## 2018-04-22 ENCOUNTER — Inpatient Hospital Stay (HOSPITAL_COMMUNITY): Payer: Medicare HMO

## 2018-04-22 ENCOUNTER — Encounter (HOSPITAL_COMMUNITY): Payer: Self-pay | Admitting: *Deleted

## 2018-04-22 LAB — CBC
HCT: 32.8 % — ABNORMAL LOW (ref 36.0–46.0)
Hemoglobin: 10.7 g/dL — ABNORMAL LOW (ref 12.0–15.0)
MCH: 29.4 pg (ref 26.0–34.0)
MCHC: 32.6 g/dL (ref 30.0–36.0)
MCV: 90.1 fL (ref 78.0–100.0)
PLATELETS: 211 10*3/uL (ref 150–400)
RBC: 3.64 MIL/uL — ABNORMAL LOW (ref 3.87–5.11)
RDW: 13.9 % (ref 11.5–15.5)
WBC: 4.7 10*3/uL (ref 4.0–10.5)

## 2018-04-22 LAB — HEPARIN LEVEL (UNFRACTIONATED): Heparin Unfractionated: 0.22 IU/mL — ABNORMAL LOW (ref 0.30–0.70)

## 2018-04-22 MED ORDER — WARFARIN SODIUM 7.5 MG PO TABS
7.5000 mg | ORAL_TABLET | Freq: Once | ORAL | Status: AC
  Administered 2018-04-22: 7.5 mg via ORAL
  Filled 2018-04-22: qty 1

## 2018-04-22 MED ORDER — CEFAZOLIN SODIUM-DEXTROSE 2-4 GM/100ML-% IV SOLN
2.0000 g | Freq: Three times a day (TID) | INTRAVENOUS | Status: AC
  Administered 2018-04-22 (×3): 2 g via INTRAVENOUS
  Filled 2018-04-22 (×4): qty 100

## 2018-04-22 MED ORDER — WARFARIN - PHARMACIST DOSING INPATIENT
Freq: Every day | Status: DC
  Administered 2018-04-22 – 2018-04-23 (×2)
  Administered 2018-04-25: 1
  Administered 2018-04-28 – 2018-05-05 (×2)
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
## 2018-04-22 NOTE — Progress Notes (Signed)
Report called to Earlie Server, RN re: transfer of patient to [phone].  Patient aware of transfer and change in level of care.

## 2018-04-22 NOTE — Addendum Note (Signed)
Addendum  created 04/22/18 [id number] by [name], MD   Sign clinical note
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
## 2018-04-23 LAB — CBC
HCT: 28.4 % — ABNORMAL LOW (ref 36.0–46.0)
HEMOGLOBIN: 9.4 g/dL — AB (ref 12.0–15.0)
MCH: 30 pg (ref 26.0–34.0)
MCHC: 33.1 g/dL (ref 30.0–36.0)
MCV: 90.7 fL (ref 78.0–100.0)
Platelets: 205 10*3/uL (ref 150–400)
RBC: 3.13 MIL/uL — ABNORMAL LOW (ref 3.87–5.11)
RDW: 14 % (ref 11.5–15.5)
WBC: 11.1 10*3/uL — ABNORMAL HIGH (ref 4.0–10.5)

## 2018-04-23 LAB — PROTIME-INR
INR: 1.7
Prothrombin Time: 19.8 seconds — ABNORMAL HIGH (ref 11.4–15.2)

## 2018-04-23 LAB — HEPARIN LEVEL (UNFRACTIONATED): Heparin Unfractionated: <0.1 IU/mL — ABNORMAL LOW (ref 0.30–0.70)

## 2018-04-23 MED ORDER — DOCUSATE SODIUM 100 MG PO CAPS
100.0000 mg | ORAL_CAPSULE | Freq: Every day | ORAL | Status: DC
  Administered 2018-04-23 – 2018-05-05 (×12): 100 mg via ORAL
  Filled 2018-04-23 (×12): qty 1

## 2018-04-23 MED ORDER — METHOCARBAMOL 750 MG PO TABS
750.0000 mg | ORAL_TABLET | Freq: Three times a day (TID) | ORAL | Status: DC
  Administered 2018-04-23 – 2018-05-02 (×19): 750 mg via ORAL
  Filled 2018-04-23 (×26): qty 1

## 2018-04-23 MED ORDER — OXYCODONE HCL 5 MG PO TABS
5.0000 mg | ORAL_TABLET | Freq: Four times a day (QID) | ORAL | Status: DC | PRN
  Filled 2018-04-23: qty 1

## 2018-04-23 MED ORDER — WARFARIN SODIUM 5 MG PO TABS
5.0000 mg | ORAL_TABLET | Freq: Once | ORAL | Status: AC
  Administered 2018-04-23: 5 mg via ORAL
  Filled 2018-04-23: qty 1

## 2018-04-23 MED ORDER — POLYETHYLENE GLYCOL 3350 17 G PO PACK
17.0000 g | PACK | Freq: Every day | ORAL | Status: DC | PRN
  Administered 2018-04-24: 17 g via ORAL
  Filled 2018-04-23: qty 1

## 2018-04-23 MED ORDER — TRAMADOL HCL 50 MG PO TABS
50.0000 mg | ORAL_TABLET | Freq: Four times a day (QID) | ORAL | Status: DC | PRN
  Administered 2018-04-23 – 2018-04-30 (×4): 50 mg via ORAL
  Filled 2018-04-23 (×6): qty 1

## 2018-04-23 MED ORDER — ACETAMINOPHEN 500 MG PO TABS
1000.0000 mg | ORAL_TABLET | Freq: Four times a day (QID) | ORAL | Status: DC
  Administered 2018-04-23 – 2018-05-05 (×16): 1000 mg via ORAL
  Filled 2018-04-23 (×27): qty 2
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
## 2018-04-23 NOTE — Progress Notes (Signed)
Physical Therapy Note  Dr. Ezequiel Essex:   Please advise -- can Ms. Devenport bear weight through her L elbow for use of a platform walker?  Thank you,  Roney Marion, [place] Pager [phone] Office [phone]

## 2018-04-23 NOTE — Progress Notes (Signed)
Rehab Admissions Coordinator Note:  Patient was screened by Cleatrice Burke for appropriateness for an Inpatient Acute Rehab Consult per PT recommendation.  At this time, we are recommending Inpatient Rehab consult.  Cleatrice Burke 04/23/2018, 7:34 PM  I can be reached at [phone].
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
## 2018-04-24 ENCOUNTER — Encounter (HOSPITAL_COMMUNITY): Payer: Self-pay | Admitting: Orthopedic Surgery

## 2018-04-24 DIAGNOSIS — D72829 Elevated white blood cell count, unspecified: Secondary | ICD-10-CM

## 2018-04-24 DIAGNOSIS — I10 Essential (primary) hypertension: Secondary | ICD-10-CM

## 2018-04-24 DIAGNOSIS — I4891 Unspecified atrial fibrillation: Secondary | ICD-10-CM

## 2018-04-24 DIAGNOSIS — D62 Acute posthemorrhagic anemia: Secondary | ICD-10-CM

## 2018-04-24 DIAGNOSIS — G8918 Other acute postprocedural pain: Secondary | ICD-10-CM

## 2018-04-24 DIAGNOSIS — S2249XA Multiple fractures of ribs, unspecified side, initial encounter for closed fracture: Secondary | ICD-10-CM

## 2018-04-24 DIAGNOSIS — S62102A Fracture of unspecified carpal bone, left wrist, initial encounter for closed fracture: Secondary | ICD-10-CM

## 2018-04-24 LAB — COMPREHENSIVE METABOLIC PANEL
ALBUMIN: 2.7 g/dL — AB (ref 3.5–5.0)
ALT: 20 U/L (ref 0–44)
AST: 21 U/L (ref 15–41)
Alkaline Phosphatase: 57 U/L (ref 38–126)
Anion gap: 8 (ref 5–15)
BUN: 18 mg/dL (ref 8–23)
CHLORIDE: 104 mmol/L (ref 98–111)
CO2: 27 mmol/L (ref 22–32)
Calcium: 8.5 mg/dL — ABNORMAL LOW (ref 8.9–10.3)
Creatinine, Ser: 0.63 mg/dL (ref 0.44–1.00)
GFR calc Af Amer: >60 mL/min (ref >60)
GFR calc non Af Amer: >60 mL/min (ref >60)
GLUCOSE: 150 mg/dL — AB (ref 70–99)
POTASSIUM: 4.6 mmol/L (ref 3.5–5.1)
Sodium: 139 mmol/L (ref 135–145)
Total Bilirubin: 0.8 mg/dL (ref 0.3–1.2)
Total Protein: 5.6 g/dL — ABNORMAL LOW (ref 6.5–8.1)

## 2018-04-24 LAB — IRON AND TIBC
Iron: 47 ug/dL (ref 28–170)
SATURATION RATIOS: 17 % (ref 10.4–31.8)
TIBC: 276 ug/dL (ref 250–450)
UIBC: 229 ug/dL

## 2018-04-24 LAB — CBC
HCT: 28.3 % — ABNORMAL LOW (ref 36.0–46.0)
Hemoglobin: 9.1 g/dL — ABNORMAL LOW (ref 12.0–15.0)
MCH: 29.6 pg (ref 26.0–34.0)
MCHC: 32.2 g/dL (ref 30.0–36.0)
MCV: 92.2 fL (ref 78.0–100.0)
PLATELETS: 209 10*3/uL (ref 150–400)
RBC: 3.07 MIL/uL — ABNORMAL LOW (ref 3.87–5.11)
RDW: 14.3 % (ref 11.5–15.5)
WBC: 11.6 10*3/uL — AB (ref 4.0–10.5)

## 2018-04-24 LAB — FOLATE: Folate: 4.7 ng/mL — ABNORMAL LOW (ref >5.9)

## 2018-04-24 LAB — RETICULOCYTES
RBC.: 3.07 MIL/uL — ABNORMAL LOW (ref 3.87–5.11)
RETIC CT PCT: 2.5 % (ref 0.4–3.1)
Retic Count, Absolute: 76.8 10*3/uL (ref 19.0–186.0)

## 2018-04-24 LAB — VITAMIN B12: Vitamin B-12: 199 pg/mL (ref 180–914)

## 2018-04-24 LAB — PROTIME-INR
INR: 1.72
Prothrombin Time: 20 seconds — ABNORMAL HIGH (ref 11.4–15.2)

## 2018-04-24 LAB — FERRITIN: FERRITIN: 189 ng/mL (ref 11–307)

## 2018-04-24 LAB — OCCULT BLOOD X 1 CARD TO LAB, STOOL: Fecal Occult Bld: NEGATIVE

## 2018-04-24 MED ORDER — WARFARIN SODIUM 5 MG PO TABS
10.0000 mg | ORAL_TABLET | Freq: Once | ORAL | Status: AC
  Administered 2018-04-24: 10 mg via ORAL
  Filled 2018-04-24 (×2): qty 2

## 2018-04-24 MED ORDER — FLEET ENEMA 7-19 GM/118ML RE ENEM
1.0000 | ENEMA | Freq: Once | RECTAL | Status: AC
  Administered 2018-04-24: 1 via RECTAL
  Filled 2018-04-24: qty 1

## 2018-04-24 NOTE — Progress Notes (Signed)
Inpatient Rehabilitation Admissions Coordinator  I met with pt and she prefers in[pt rehab rather than SNF. I have begun insurance authorization. I will follow tomorrow.  Danne Baxter, RN, MSN Rehab Admissions Coordinator [phone] 04/24/2018 3:34 PM'
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
## 2018-04-24 NOTE — Care Management Important Message (Signed)
Important Message  Patient Details  Name: Nancy Thomas MRN: [id number] Date of Birth: [birth year]   Medicare Important Message Given:  Yes    Orbie Pyo 04/24/2018, 10:22 AM

## 2018-04-24 NOTE — Progress Notes (Signed)
Inpatient Rehabilitation Admissions Coordinator  I await OT eval to begin insurance authorization for a possible inpt rehab admit. I have notified OT.  Danne Baxter, RN, MSN Rehab Admissions Coordinator [phone] 04/24/2018 1:35 PM
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
## 2018-04-25 ENCOUNTER — Inpatient Hospital Stay (HOSPITAL_COMMUNITY): Payer: Medicare HMO

## 2018-04-25 DIAGNOSIS — R109 Unspecified abdominal pain: Secondary | ICD-10-CM

## 2018-04-25 DIAGNOSIS — D649 Anemia, unspecified: Secondary | ICD-10-CM

## 2018-04-25 LAB — BASIC METABOLIC PANEL
ANION GAP: 7 (ref 5–15)
BUN: 15 mg/dL (ref 8–23)
CALCIUM: 8.5 mg/dL — AB (ref 8.9–10.3)
CHLORIDE: 101 mmol/L (ref 98–111)
CO2: 25 mmol/L (ref 22–32)
CREATININE: 0.55 mg/dL (ref 0.44–1.00)
GFR calc non Af Amer: >60 mL/min (ref >60)
Glucose, Bld: 158 mg/dL — ABNORMAL HIGH (ref 70–99)
Potassium: 4.4 mmol/L (ref 3.5–5.1)
SODIUM: 133 mmol/L — AB (ref 135–145)

## 2018-04-25 LAB — CBC
HEMATOCRIT: 33.1 % — AB (ref 36.0–46.0)
HEMOGLOBIN: 10.8 g/dL — AB (ref 12.0–15.0)
MCH: 29.7 pg (ref 26.0–34.0)
MCHC: 32.6 g/dL (ref 30.0–36.0)
MCV: 90.9 fL (ref 78.0–100.0)
Platelets: 275 10*3/uL (ref 150–400)
RBC: 3.64 MIL/uL — ABNORMAL LOW (ref 3.87–5.11)
RDW: 14.6 % (ref 11.5–15.5)
WBC: 11.2 10*3/uL — AB (ref 4.0–10.5)

## 2018-04-25 LAB — PROTIME-INR
INR: 1.57
PROTHROMBIN TIME: 18.7 s — AB (ref 11.4–15.2)

## 2018-04-25 MED ORDER — BISACODYL 10 MG RE SUPP
10.0000 mg | Freq: Once | RECTAL | Status: AC
  Administered 2018-04-25: 10 mg via RECTAL
  Filled 2018-04-25: qty 1

## 2018-04-25 MED ORDER — WARFARIN SODIUM 7.5 MG PO TABS
7.5000 mg | ORAL_TABLET | Freq: Once | ORAL | Status: AC
  Administered 2018-04-25: 7.5 mg via ORAL
  Filled 2018-04-25: qty 1

## 2018-04-25 MED ORDER — ALUM HYDROXIDE-MAG CARBONATE 95-358 MG/15ML PO SUSP
15.0000 mL | ORAL | Status: DC | PRN
  Administered 2018-04-25 – 2018-04-27 (×2): 10 mL via ORAL
  Administered 2018-04-28 – 2018-04-30 (×3): 15 mL via ORAL
  Filled 2018-04-25 (×6): qty 30
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
## 2018-04-25 NOTE — Progress Notes (Signed)
Pt c/o reflux and an upset stomach since yesterday afternoon.  Pt requests suppository in hopes that it will provide her some relief.  This RN encourages pt to participate more in her mobility in and out of bed. Also to participate in ADL's to her current ability.  Pt refused to bend knees in bed to assist this nurse and th NT reposition her in bed, pt states " I don't even do that at home." Pt repositioned with mod difficulty.

## 2018-04-25 NOTE — Plan of Care (Signed)
  Problem: Education: Goal: Knowledge of General Education information will improve Description Including pain rating scale, medication(s)/side effects and non-pharmacologic comfort measures Outcome: Progressing Note:  POC reviewed with pt.   

## 2018-04-25 NOTE — Progress Notes (Signed)
Pt c/o stomach pain related to not having a complete bowel movement in a few days.  Pt states she "can't take anything else in" because it hurts too bad.  Call placed to MD via amion to notify of above, awaiting response.  [id number]
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
## 2018-04-25 NOTE — Progress Notes (Signed)
Pt requesting Gaviscon to help with reflux, call placed to request above from MD.

## 2018-04-25 NOTE — Clinical Social Work Note (Signed)
Clinical Social Work Assessment  Patient Details  Name: Nancy Thomas MRN: [id number] Date of Birth: 1950-11-05  Date of referral:  04/25/18               Reason for consult:  Facility Placement                Permission sought to share information with:  Facility Art therapist granted to share information::  Yes, Verbal Permission Granted  Name::        Agency::  SNFs  Relationship::     Contact Information:     Housing/Transportation Living arrangements for the past 2 months:  Single Family Home Source of Information:  Patient Patient Interpreter Needed:  None Criminal Activity/Legal Involvement Pertinent to Current Situation/Hospitalization:  No - Comment as needed Significant Relationships:  Siblings Lives with:  Siblings Do you feel safe going back to the place where you live?  No Need for family participation in patient care:  Yes (Comment)  Care giving concerns:  CSW received consult for discharge needs. Patient was denied for CIR.  CSW spoke with patient regarding rehab at [facility]. Patient states she is willing to go to SNF placement in [facility]. Patient she and her brother live in the home together but she takes care of him.    Social Worker assessment / plan:  CSW spoke with patient concerning rehab at [facility] before returning home.  Employment status:  Other (Comment) Insurance information:  Medicare([employer]) PT Recommendations:  [place] / Referral to community resources:  [place]  Patient/Family's Response to care:  Patient recognizes need for rehab before returning home and is agreeable to a SNF in [place]. CSW will provide listing of facilities to patient.  Patient/Family's Understanding of and Emotional Response to Diagnosis, Current Treatment, and Prognosis:  Patient is realistic regarding therapy needs and expressed being hopeful for SNF placement. Patient expressed understanding of CSW role  and discharge process as well as medical condition. No questions/concerns about plan or treatment.   Emotional Assessment Appearance:  Appears stated age Attitude/Demeanor/Rapport:  Engaged Affect (typically observed):  Accepting, Appropriate Orientation:  Oriented to Self, Oriented to Place, Oriented to  Time, Oriented to Situation Alcohol / Substance use:  Not Applicable Psych involvement (Current and /or in the community):  No (Comment)  Discharge Needs  Concerns to be addressed:  Care Coordination Readmission within the last 30 days:  No Current discharge risk:    Barriers to Discharge:  Continued Medical Work up   [employer], [place] 04/25/2018, 2:36 PM

## 2018-04-25 NOTE — Progress Notes (Signed)
Inpatient Rehabilitation Admissions Coordinator  [employer] medicare has denied approval for an inpatient rehab admit. I met with patient at bedside and she is aware. They will approve SNF and she is aware. I alerted RN CM and SW of need for SNF. We will sign off at this time.  Danne Baxter, RN, MSN Rehab Admissions Coordinator [phone] 04/25/2018 2:15 PM
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
## 2018-04-25 NOTE — Progress Notes (Signed)
CSW was informed patient was denied CIR- CSW provided patient with bed offers. Patient will decide and inform the CSW.  Thurmond Butts, [place] Social Worker [phone]
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
## 2018-04-26 LAB — BASIC METABOLIC PANEL
ANION GAP: 10 (ref 5–15)
BUN: 14 mg/dL (ref 8–23)
CALCIUM: 8.6 mg/dL — AB (ref 8.9–10.3)
CO2: 24 mmol/L (ref 22–32)
CREATININE: 0.59 mg/dL (ref 0.44–1.00)
Chloride: 99 mmol/L (ref 98–111)
GLUCOSE: 164 mg/dL — AB (ref 70–99)
Potassium: 4.3 mmol/L (ref 3.5–5.1)
Sodium: 133 mmol/L — ABNORMAL LOW (ref 135–145)

## 2018-04-26 LAB — CBC
HEMATOCRIT: 33.7 % — AB (ref 36.0–46.0)
Hemoglobin: 11 g/dL — ABNORMAL LOW (ref 12.0–15.0)
MCH: 29.8 pg (ref 26.0–34.0)
MCHC: 32.6 g/dL (ref 30.0–36.0)
MCV: 91.3 fL (ref 78.0–100.0)
PLATELETS: 326 10*3/uL (ref 150–400)
RBC: 3.69 MIL/uL — ABNORMAL LOW (ref 3.87–5.11)
RDW: 14.6 % (ref 11.5–15.5)
WBC: 9.8 10*3/uL (ref 4.0–10.5)

## 2018-04-26 LAB — PROTIME-INR
INR: 2.48
Prothrombin Time: 26.6 seconds — ABNORMAL HIGH (ref 11.4–15.2)

## 2018-04-26 MED ORDER — WARFARIN SODIUM 4 MG PO TABS
4.0000 mg | ORAL_TABLET | Freq: Once | ORAL | Status: AC
  Administered 2018-04-26: 4 mg via ORAL
  Filled 2018-04-26: qty 1

## 2018-04-26 MED ORDER — BISACODYL 5 MG PO TBEC
5.0000 mg | DELAYED_RELEASE_TABLET | Freq: Every day | ORAL | Status: DC | PRN
  Administered 2018-04-26 – 2018-05-01 (×4): 5 mg via ORAL
  Filled 2018-04-26 (×4): qty 1
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
## 2018-04-26 NOTE — Plan of Care (Signed)
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
## 2018-04-27 ENCOUNTER — Inpatient Hospital Stay (HOSPITAL_COMMUNITY): Payer: Medicare HMO

## 2018-04-27 LAB — PROTIME-INR
INR: 2.98
PROTHROMBIN TIME: 30.8 s — AB (ref 11.4–15.2)

## 2018-04-27 MED ORDER — WARFARIN SODIUM 2.5 MG PO TABS
2.5000 mg | ORAL_TABLET | Freq: Once | ORAL | Status: AC
  Administered 2018-04-27: 2.5 mg via ORAL
  Filled 2018-04-27: qty 1

## 2018-04-27 NOTE — Progress Notes (Signed)
Update  Has L Patellar Fracture on the CT Ortho aware and will eval as prn  Verneita Griffes, MD [facility] [phone]
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
## 2018-04-27 NOTE — Progress Notes (Addendum)
Patient informed the CSW that she no longer wants [facility] and she would refuse to go even if placement was offered - her preference is [facility] informed [facility] of patient's decision. CSW informed patient and RNCM.    Thurmond Butts, [place] Social Worker [phone]
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
## 2018-04-27 NOTE — Progress Notes (Signed)
Orthopedic Tech Progress Note Patient Details:  Nancy Thomas 06/06/1951 [id number]  Ortho Devices Type of Ortho Device: Knee Immobilizer       [name] 04/27/2018, 3:15 PM
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
## 2018-04-27 NOTE — Progress Notes (Signed)
PT Cancellation Note  Patient Details Name: Nancy Thomas MRN: [id number] DOB: 09-06-1951   Cancelled Treatment:    Reason Eval/Treat Not Completed: (P) Patient at procedure or test/unavailable(Pt off unit for CT scan will return later this pm for PT tx.  )   Cristela Blue 04/27/2018, 10:57 AM  Governor Rooks, PTA pager [phone]

## 2018-04-27 NOTE — Progress Notes (Signed)
Patient ID: Nancy Thomas, female   DOB: May 13, 1951, 67 y.o.   MRN: [id number]  Asked to review CT of [place]'s right knee. She has been unable to bear weight on that leg since admission. CT shows a comminuted but mostly vertically oriented fx of the patella without displacement. May WBAT in [place], f/u with Dr. Marcelino Scot as already directed for her wrist.    Lisette Abu, PA-C Orthopedic Surgery [phone]
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
## 2018-04-27 NOTE — Plan of Care (Signed)
  Problem: Education: °Goal: Knowledge of General Education information will improve °Description: Including pain rating scale, medication(s)/side effects and non-pharmacologic comfort measures °Outcome: Progressing °  °Problem: Clinical Measurements: °Goal: Ability to maintain clinical measurements within normal limits will improve °Outcome: Progressing °Goal: Will remain free from infection °Outcome: Progressing °Goal: Diagnostic test results will improve °Outcome: Progressing °Goal: Respiratory complications will improve °Outcome: Progressing °Goal: Cardiovascular complication will be avoided °Outcome: Progressing °  °Problem: Activity: °Goal: Risk for activity intolerance will decrease °Outcome: Progressing °  °Problem: Coping: °Goal: Level of anxiety will decrease °Outcome: Progressing °  °Problem: Elimination: °Goal: Will not experience complications related to bowel motility °Outcome: Progressing °Goal: Will not experience complications related to urinary retention °Outcome: Progressing °  °Problem: Safety: °Goal: Ability to remain free from injury will improve °Outcome: Progressing °  °Problem: Skin Integrity: °Goal: Risk for impaired skin integrity will decrease °Outcome: Progressing °  °

## 2018-04-28 ENCOUNTER — Inpatient Hospital Stay (HOSPITAL_COMMUNITY): Payer: Medicare HMO

## 2018-04-28 DIAGNOSIS — R079 Chest pain, unspecified: Secondary | ICD-10-CM

## 2018-04-28 LAB — COMPREHENSIVE METABOLIC PANEL
ALBUMIN: 3 g/dL — AB (ref 3.5–5.0)
ALK PHOS: 88 U/L (ref 38–126)
ALT: 41 U/L (ref 0–44)
ANION GAP: 15 (ref 5–15)
AST: 39 U/L (ref 15–41)
BUN: 19 mg/dL (ref 8–23)
CALCIUM: 8.8 mg/dL — AB (ref 8.9–10.3)
CHLORIDE: 99 mmol/L (ref 98–111)
CO2: 20 mmol/L — AB (ref 22–32)
Creatinine, Ser: 0.66 mg/dL (ref 0.44–1.00)
GFR calc Af Amer: >60 mL/min (ref >60)
GFR calc non Af Amer: >60 mL/min (ref >60)
GLUCOSE: 218 mg/dL — AB (ref 70–99)
Potassium: 4.6 mmol/L (ref 3.5–5.1)
SODIUM: 134 mmol/L — AB (ref 135–145)
Total Bilirubin: 1.8 mg/dL — ABNORMAL HIGH (ref 0.3–1.2)
Total Protein: 6.6 g/dL (ref 6.5–8.1)

## 2018-04-28 LAB — URINALYSIS, ROUTINE W REFLEX MICROSCOPIC
BACTERIA UA: NONE SEEN
BILIRUBIN URINE: NEGATIVE
Glucose, UA: NEGATIVE mg/dL
KETONES UR: NEGATIVE mg/dL
LEUKOCYTES UA: NEGATIVE
NITRITE: NEGATIVE
Protein, ur: NEGATIVE mg/dL
Specific Gravity, Urine: 1.033 — ABNORMAL HIGH (ref 1.005–1.030)
pH: 6 (ref 5.0–8.0)

## 2018-04-28 LAB — CBC WITH DIFFERENTIAL/PLATELET
ABS IMMATURE GRANULOCYTES: 0.1 10*3/uL (ref 0.0–0.1)
BASOS ABS: 0 10*3/uL (ref 0.0–0.1)
BASOS PCT: 0 %
Eosinophils Absolute: 0 10*3/uL (ref 0.0–0.7)
Eosinophils Relative: 0 %
HCT: 33.9 % — ABNORMAL LOW (ref 36.0–46.0)
HEMOGLOBIN: 11.2 g/dL — AB (ref 12.0–15.0)
Immature Granulocytes: 1 %
LYMPHS PCT: 6 %
Lymphs Abs: 0.7 10*3/uL (ref 0.7–4.0)
MCH: 29.6 pg (ref 26.0–34.0)
MCHC: 33 g/dL (ref 30.0–36.0)
MCV: 89.7 fL (ref 78.0–100.0)
Monocytes Absolute: 0.6 10*3/uL (ref 0.1–1.0)
Monocytes Relative: 5 %
NEUTROS ABS: 9.9 10*3/uL — AB (ref 1.7–7.7)
Neutrophils Relative %: 88 %
PLATELETS: 353 10*3/uL (ref 150–400)
RBC: 3.78 MIL/uL — AB (ref 3.87–5.11)
RDW: 14.6 % (ref 11.5–15.5)
WBC: 11.4 10*3/uL — AB (ref 4.0–10.5)

## 2018-04-28 LAB — ECHOCARDIOGRAM LIMITED
HEIGHTINCHES: 62.992 in
Weight: 4419.78 oz

## 2018-04-28 LAB — MAGNESIUM: Magnesium: 2.1 mg/dL (ref 1.7–2.4)

## 2018-04-28 LAB — TROPONIN I: Troponin I: <0.03 ng/mL (ref <0.03)

## 2018-04-28 LAB — PROTIME-INR
INR: 2.86
Prothrombin Time: 29.7 seconds — ABNORMAL HIGH (ref 11.4–15.2)

## 2018-04-28 MED ORDER — PERFLUTREN LIPID MICROSPHERE
1.0000 mL | INTRAVENOUS | Status: AC | PRN
  Filled 2018-04-28: qty 10

## 2018-04-28 MED ORDER — NITROGLYCERIN 0.4 MG SL SUBL
SUBLINGUAL_TABLET | SUBLINGUAL | Status: AC
  Administered 2018-04-28: 03:00:00
  Filled 2018-04-28: qty 1

## 2018-04-28 MED ORDER — KETOROLAC TROMETHAMINE 30 MG/ML IJ SOLN
INTRAMUSCULAR | Status: AC
  Filled 2018-04-28: qty 1

## 2018-04-28 MED ORDER — FAMOTIDINE IN NACL 20-0.9 MG/50ML-% IV SOLN
20.0000 mg | Freq: Two times a day (BID) | INTRAVENOUS | Status: DC
  Administered 2018-04-28 (×2): 20 mg via INTRAVENOUS
  Filled 2018-04-28 (×2): qty 50

## 2018-04-28 MED ORDER — MORPHINE SULFATE (PF) 2 MG/ML IV SOLN: 1.0000 mg | INTRAVENOUS | Status: DC | PRN

## 2018-04-28 MED ORDER — METOPROLOL TARTRATE 5 MG/5ML IV SOLN
5.0000 mg | Freq: Once | INTRAVENOUS | Status: AC
  Administered 2018-04-28: 5 mg via INTRAVENOUS

## 2018-04-28 MED ORDER — ASPIRIN 81 MG PO CHEW
162.0000 mg | CHEWABLE_TABLET | Freq: Once | ORAL | Status: AC
  Administered 2018-04-28: 162 mg via ORAL
  Filled 2018-04-28: qty 2

## 2018-04-28 MED ORDER — WARFARIN SODIUM 4 MG PO TABS
4.0000 mg | ORAL_TABLET | Freq: Once | ORAL | Status: AC
  Administered 2018-04-28: 4 mg via ORAL
  Filled 2018-04-28: qty 1

## 2018-04-28 MED ORDER — MORPHINE SULFATE (PF) 2 MG/ML IV SOLN
1.0000 mg | Freq: Once | INTRAVENOUS | Status: AC
  Administered 2018-04-28: 1 mg via INTRAVENOUS

## 2018-04-28 MED ORDER — MORPHINE SULFATE (PF) 2 MG/ML IV SOLN
2.0000 mg | INTRAVENOUS | Status: DC | PRN
  Administered 2018-04-28 (×2): 2 mg via INTRAVENOUS
  Administered 2018-04-28: 4 mg via INTRAVENOUS
  Administered 2018-04-28 – 2018-04-29 (×3): 2 mg via INTRAVENOUS
  Administered 2018-04-29: 4 mg via INTRAVENOUS
  Filled 2018-04-28 (×4): qty 1
  Filled 2018-04-28 (×2): qty 2
  Filled 2018-04-28: qty 1

## 2018-04-28 MED ORDER — NITROGLYCERIN 0.4 MG SL SUBL
SUBLINGUAL_TABLET | SUBLINGUAL | Status: AC
  Filled 2018-04-28: qty 1

## 2018-04-28 MED ORDER — FAMOTIDINE 20 MG PO TABS
20.0000 mg | ORAL_TABLET | Freq: Two times a day (BID) | ORAL | Status: DC
Start: 2018-04-28 — End: 2018-05-06
  Administered 2018-04-28 – 2018-05-05 (×14): 20 mg via ORAL
  Filled 2018-04-28 (×14): qty 1

## 2018-04-28 MED ORDER — HYDROCODONE-ACETAMINOPHEN 5-325 MG PO TABS
1.0000 | ORAL_TABLET | Freq: Once | ORAL | Status: DC
  Filled 2018-04-28: qty 2

## 2018-04-28 MED ORDER — METOPROLOL TARTRATE 5 MG/5ML IV SOLN
5.0000 mg | INTRAVENOUS | Status: DC | PRN
  Filled 2018-04-28: qty 5

## 2018-04-28 MED ORDER — IOPAMIDOL (ISOVUE-300) INJECTION 61%
INTRAVENOUS | Status: AC
  Administered 2018-04-28: 15:00:00
  Filled 2018-04-28: qty 30

## 2018-04-28 MED ORDER — IOHEXOL 300 MG/ML  SOLN
100.0000 mL | Freq: Once | INTRAMUSCULAR | Status: AC | PRN
  Administered 2018-04-28: 100 mL via INTRAVENOUS

## 2018-04-28 MED ORDER — NITROGLYCERIN 0.4 MG SL SUBL
0.4000 mg | SUBLINGUAL_TABLET | SUBLINGUAL | Status: AC | PRN
  Administered 2018-04-28 (×2): 0.4 mg via SUBLINGUAL

## 2018-04-28 MED ORDER — GI COCKTAIL ~~LOC~~
30.0000 mL | Freq: Once | ORAL | Status: AC
  Administered 2018-04-28: 30 mL via ORAL
  Filled 2018-04-28: qty 30

## 2018-04-28 MED ORDER — KETOROLAC TROMETHAMINE 30 MG/ML IJ SOLN
15.0000 mg | Freq: Once | INTRAMUSCULAR | Status: AC
  Administered 2018-04-28: 15 mg via INTRAVENOUS
  Filled 2018-04-28: qty 1

## 2018-04-28 MED ORDER — MORPHINE SULFATE (PF) 2 MG/ML IV SOLN
INTRAVENOUS | Status: AC
  Filled 2018-04-28: qty 1

## 2018-04-28 NOTE — Significant Event (Addendum)
Rapid Response Event Note  Called by RN for pt c/o CP 10/10 radiating from chest around to L back. Upon arrival pt diaphoretic in bed with CP not resolved after nitox1 given. Another dose of nitro given. Pt HR 96, BP 158/99, RR 18, spO2 100% on 2L, Breath sounds clear/diminished. HR increased to 120s  Interventions: [place] NP and Opyd Md contacted by bedside RN and aware. Nitro, EKG, Toradol, Labs, Morphine, CXR, Metoprolol.   HR decreased to 90s  Plan of Care (if not transferred): Will continue to monitor for chest pain. Pain medication given. Will give medication for indigestion. Awaiting lab results and Troponin level. Informed RN to call with any changes or concerns.      Troponin negative, pt more comfortable per bedside RN. Echo ordered. Will continue to monitor.      Sherilyn Dacosta
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
## 2018-04-28 NOTE — Progress Notes (Signed)
  Echocardiogram 2D Echocardiogram has been performed.  Taeya Theall G Trivia Heffelfinger 04/28/2018, 2:01 PM
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
## 2018-04-28 NOTE — Progress Notes (Signed)
  Echocardiogram 2D Echocardiogram has been attempted. Patient left / not in room. Will attempt another time.  Nancy Thomas G Nancy Thomas 04/28/2018, 9:20 AM
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
## 2018-04-29 LAB — CBC WITH DIFFERENTIAL/PLATELET
ABS IMMATURE GRANULOCYTES: 0.1 10*3/uL (ref 0.0–0.1)
BASOS ABS: 0 10*3/uL (ref 0.0–0.1)
Basophils Relative: 0 %
Eosinophils Absolute: 0.2 10*3/uL (ref 0.0–0.7)
Eosinophils Relative: 2 %
HCT: 34.2 % — ABNORMAL LOW (ref 36.0–46.0)
Hemoglobin: 11.2 g/dL — ABNORMAL LOW (ref 12.0–15.0)
IMMATURE GRANULOCYTES: 1 %
LYMPHS PCT: 10 %
Lymphs Abs: 1 10*3/uL (ref 0.7–4.0)
MCH: 29.8 pg (ref 26.0–34.0)
MCHC: 32.7 g/dL (ref 30.0–36.0)
MCV: 91 fL (ref 78.0–100.0)
MONO ABS: 1.1 10*3/uL — AB (ref 0.1–1.0)
Monocytes Relative: 11 %
NEUTROS ABS: 8 10*3/uL — AB (ref 1.7–7.7)
NEUTROS PCT: 76 %
PLATELETS: 277 10*3/uL (ref 150–400)
RBC: 3.76 MIL/uL — AB (ref 3.87–5.11)
RDW: 14.3 % (ref 11.5–15.5)
WBC: 10.4 10*3/uL (ref 4.0–10.5)

## 2018-04-29 LAB — COMPREHENSIVE METABOLIC PANEL
ALBUMIN: 3 g/dL — AB (ref 3.5–5.0)
ALT: 32 U/L (ref 0–44)
ANION GAP: 8 (ref 5–15)
AST: 26 U/L (ref 15–41)
Alkaline Phosphatase: 92 U/L (ref 38–126)
BILIRUBIN TOTAL: 1.7 mg/dL — AB (ref 0.3–1.2)
BUN: 13 mg/dL (ref 8–23)
CO2: 25 mmol/L (ref 22–32)
Calcium: 8.7 mg/dL — ABNORMAL LOW (ref 8.9–10.3)
Chloride: 98 mmol/L (ref 98–111)
Creatinine, Ser: 0.51 mg/dL (ref 0.44–1.00)
GFR calc non Af Amer: >60 mL/min (ref >60)
GLUCOSE: 147 mg/dL — AB (ref 70–99)
POTASSIUM: 4.2 mmol/L (ref 3.5–5.1)
SODIUM: 131 mmol/L — AB (ref 135–145)
TOTAL PROTEIN: 6.7 g/dL (ref 6.5–8.1)

## 2018-04-29 LAB — PROTIME-INR
INR: 2.85
Prothrombin Time: 29.7 seconds — ABNORMAL HIGH (ref 11.4–15.2)

## 2018-04-29 LAB — URINE CULTURE

## 2018-04-29 MED ORDER — WARFARIN SODIUM 5 MG PO TABS
5.0000 mg | ORAL_TABLET | Freq: Once | ORAL | Status: AC
  Administered 2018-04-29: 5 mg via ORAL
  Filled 2018-04-29: qty 1

## 2018-04-29 MED ORDER — SUCRALFATE 1 GM/10ML PO SUSP
1.0000 g | Freq: Three times a day (TID) | ORAL | Status: DC
  Administered 2018-04-29: 1 g via ORAL
  Filled 2018-04-29 (×2): qty 10

## 2018-04-29 NOTE — Plan of Care (Signed)
  Problem: Education: Goal: Knowledge of General Education information will improve Description: Including pain rating scale, medication(s)/side effects and non-pharmacologic comfort measures Outcome: Progressing   Problem: Activity: Goal: Risk for activity intolerance will decrease Outcome: Progressing   Problem: Elimination: Goal: Will not experience complications related to bowel motility Outcome: Progressing   Problem: Safety: Goal: Ability to remain free from injury will improve Outcome: Progressing   Problem: Skin Integrity: Goal: Risk for impaired skin integrity will decrease Outcome: Progressing   
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
## 2018-04-29 NOTE — Plan of Care (Signed)
  Problem: Education: Goal: Knowledge of General Education information will improve Description: Including pain rating scale, medication(s)/side effects and non-pharmacologic comfort measures Outcome: Progressing   Problem: Coping: Goal: Level of anxiety will decrease Outcome: Not Progressing   
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
## 2018-04-30 ENCOUNTER — Inpatient Hospital Stay (HOSPITAL_COMMUNITY): Payer: Medicare HMO

## 2018-04-30 LAB — CBC WITH DIFFERENTIAL/PLATELET
Abs Immature Granulocytes: 0.1 10*3/uL (ref 0.0–0.1)
Basophils Absolute: 0.1 10*3/uL (ref 0.0–0.1)
Basophils Relative: 0 %
EOS ABS: 0.1 10*3/uL (ref 0.0–0.7)
EOS PCT: 1 %
HEMATOCRIT: 34 % — AB (ref 36.0–46.0)
HEMOGLOBIN: 11.1 g/dL — AB (ref 12.0–15.0)
Immature Granulocytes: 1 %
LYMPHS ABS: 1.5 10*3/uL (ref 0.7–4.0)
LYMPHS PCT: 12 %
MCH: 29.4 pg (ref 26.0–34.0)
MCHC: 32.6 g/dL (ref 30.0–36.0)
MCV: 90.2 fL (ref 78.0–100.0)
MONOS PCT: 11 %
Monocytes Absolute: 1.4 10*3/uL — ABNORMAL HIGH (ref 0.1–1.0)
Neutro Abs: 9.5 10*3/uL — ABNORMAL HIGH (ref 1.7–7.7)
Neutrophils Relative %: 75 %
Platelets: 235 10*3/uL (ref 150–400)
RBC: 3.77 MIL/uL — ABNORMAL LOW (ref 3.87–5.11)
RDW: 14.2 % (ref 11.5–15.5)
WBC: 12.6 10*3/uL — ABNORMAL HIGH (ref 4.0–10.5)

## 2018-04-30 LAB — COMPREHENSIVE METABOLIC PANEL
ALK PHOS: 105 U/L (ref 38–126)
ALT: 26 U/L (ref 0–44)
AST: 23 U/L (ref 15–41)
Albumin: 3 g/dL — ABNORMAL LOW (ref 3.5–5.0)
Anion gap: 12 (ref 5–15)
BUN: 15 mg/dL (ref 8–23)
CHLORIDE: 93 mmol/L — AB (ref 98–111)
CO2: 24 mmol/L (ref 22–32)
Calcium: 8.9 mg/dL (ref 8.9–10.3)
Creatinine, Ser: 0.54 mg/dL (ref 0.44–1.00)
GFR calc Af Amer: >60 mL/min (ref >60)
GLUCOSE: 153 mg/dL — AB (ref 70–99)
Potassium: 3.9 mmol/L (ref 3.5–5.1)
Sodium: 129 mmol/L — ABNORMAL LOW (ref 135–145)
Total Bilirubin: 2.4 mg/dL — ABNORMAL HIGH (ref 0.3–1.2)
Total Protein: 6.9 g/dL (ref 6.5–8.1)

## 2018-04-30 LAB — PROTIME-INR
INR: 3.84
Prothrombin Time: 37.5 seconds — ABNORMAL HIGH (ref 11.4–15.2)

## 2018-04-30 MED ORDER — TRAZODONE HCL 50 MG PO TABS
50.0000 mg | ORAL_TABLET | Freq: Once | ORAL | Status: AC
  Administered 2018-04-30: 50 mg via ORAL
  Filled 2018-04-30: qty 1

## 2018-04-30 MED ORDER — TRAMADOL HCL 50 MG PO TABS
100.0000 mg | ORAL_TABLET | Freq: Four times a day (QID) | ORAL | Status: DC | PRN
  Administered 2018-04-30: 100 mg via ORAL
  Filled 2018-04-30: qty 2

## 2018-04-30 MED ORDER — MORPHINE SULFATE (PF) 2 MG/ML IV SOLN
2.0000 mg | INTRAVENOUS | Status: DC | PRN
  Filled 2018-04-30: qty 1

## 2018-04-30 MED ORDER — DILTIAZEM HCL ER COATED BEADS 240 MG PO CP24
240.0000 mg | ORAL_CAPSULE | Freq: Every day | ORAL | Status: DC
  Administered 2018-04-30 – 2018-05-01 (×2): 240 mg via ORAL
  Filled 2018-04-30 (×2): qty 1
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
## 2018-04-30 NOTE — Plan of Care (Signed)
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
## 2018-05-01 LAB — COMPREHENSIVE METABOLIC PANEL
ALT: 27 U/L (ref 0–44)
ANION GAP: 11 (ref 5–15)
AST: 29 U/L (ref 15–41)
Albumin: 2.8 g/dL — ABNORMAL LOW (ref 3.5–5.0)
Alkaline Phosphatase: 111 U/L (ref 38–126)
BUN: 17 mg/dL (ref 8–23)
CHLORIDE: 92 mmol/L — AB (ref 98–111)
CO2: 23 mmol/L (ref 22–32)
Calcium: 8.5 mg/dL — ABNORMAL LOW (ref 8.9–10.3)
Creatinine, Ser: 0.56 mg/dL (ref 0.44–1.00)
GFR calc non Af Amer: >60 mL/min (ref >60)
Glucose, Bld: 122 mg/dL — ABNORMAL HIGH (ref 70–99)
POTASSIUM: 4.2 mmol/L (ref 3.5–5.1)
SODIUM: 126 mmol/L — AB (ref 135–145)
Total Bilirubin: 2 mg/dL — ABNORMAL HIGH (ref 0.3–1.2)
Total Protein: 6.2 g/dL — ABNORMAL LOW (ref 6.5–8.1)

## 2018-05-01 LAB — CBC WITH DIFFERENTIAL/PLATELET
ABS IMMATURE GRANULOCYTES: 0.2 10*3/uL — AB (ref 0.0–0.1)
Basophils Absolute: 0.1 10*3/uL (ref 0.0–0.1)
Basophils Relative: 0 %
Eosinophils Absolute: 0.3 10*3/uL (ref 0.0–0.7)
Eosinophils Relative: 2 %
HEMATOCRIT: 33 % — AB (ref 36.0–46.0)
Hemoglobin: 11.2 g/dL — ABNORMAL LOW (ref 12.0–15.0)
IMMATURE GRANULOCYTES: 1 %
Lymphocytes Relative: 14 %
Lymphs Abs: 1.7 10*3/uL (ref 0.7–4.0)
MCH: 30 pg (ref 26.0–34.0)
MCHC: 33.9 g/dL (ref 30.0–36.0)
MCV: 88.5 fL (ref 78.0–100.0)
MONOS PCT: 9 %
Monocytes Absolute: 1.1 10*3/uL — ABNORMAL HIGH (ref 0.1–1.0)
NEUTROS ABS: 9.2 10*3/uL — AB (ref 1.7–7.7)
NEUTROS PCT: 74 %
PLATELETS: 185 10*3/uL (ref 150–400)
RBC: 3.73 MIL/uL — ABNORMAL LOW (ref 3.87–5.11)
RDW: 13.9 % (ref 11.5–15.5)
WBC: 12.4 10*3/uL — ABNORMAL HIGH (ref 4.0–10.5)

## 2018-05-01 LAB — PROTIME-INR
INR: 3.51
Prothrombin Time: 34.9 seconds — ABNORMAL HIGH (ref 11.4–15.2)

## 2018-05-01 LAB — SODIUM, URINE, RANDOM: SODIUM UR: 19 mmol/L

## 2018-05-01 LAB — OSMOLALITY, URINE: OSMOLALITY UR: 857 mosm/kg (ref 300–900)

## 2018-05-01 MED ORDER — DILTIAZEM HCL ER COATED BEADS 180 MG PO CP24
180.0000 mg | ORAL_CAPSULE | Freq: Every day | ORAL | Status: DC
  Administered 2018-05-02 – 2018-05-05 (×4): 180 mg via ORAL
  Filled 2018-05-01 (×4): qty 1

## 2018-05-01 MED ORDER — SODIUM CHLORIDE 0.9 % IV SOLN
INTRAVENOUS | Status: DC
  Administered 2018-05-01 – 2018-05-02 (×3): via INTRAVENOUS

## 2018-05-01 MED ORDER — TRAMADOL HCL 50 MG PO TABS
100.0000 mg | ORAL_TABLET | Freq: Three times a day (TID) | ORAL | Status: DC
  Filled 2018-05-01 (×5): qty 2

## 2018-05-01 MED ORDER — FUROSEMIDE 10 MG/ML IJ SOLN
20.0000 mg | Freq: Once | INTRAMUSCULAR | Status: AC
Start: 2018-05-01 — End: 2018-05-01
  Administered 2018-05-01: 20 mg via INTRAVENOUS
  Filled 2018-05-01: qty 2
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
## 2018-05-01 NOTE — Progress Notes (Signed)
1400 Pt is sitting up at the edge of the bed with PT at the bedside. Pt is c/o  dizziness and nausea. Then pt stopped talking, eyes rolled. Placed pt back to supine position right away, pt is responsive. V/S checked. Dr Verlon Au notified. 1600 repeated the ortho V/S lying and sitting. Pt is weak and drowsy.  Dr Verlon Au came in and seen pt. Meds adjusted.
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
## 2018-05-02 ENCOUNTER — Inpatient Hospital Stay (HOSPITAL_COMMUNITY): Payer: Medicare HMO

## 2018-05-02 LAB — CBC WITH DIFFERENTIAL/PLATELET
Abs Immature Granulocytes: 0.3 10*3/uL — ABNORMAL HIGH (ref 0.0–0.1)
Basophils Absolute: 0.1 10*3/uL (ref 0.0–0.1)
Basophils Relative: 1 %
EOS ABS: 0.3 10*3/uL (ref 0.0–0.7)
Eosinophils Relative: 3 %
HEMATOCRIT: 31.5 % — AB (ref 36.0–46.0)
HEMOGLOBIN: 10.5 g/dL — AB (ref 12.0–15.0)
IMMATURE GRANULOCYTES: 3 %
LYMPHS ABS: 1.7 10*3/uL (ref 0.7–4.0)
LYMPHS PCT: 14 %
MCH: 29.7 pg (ref 26.0–34.0)
MCHC: 33.3 g/dL (ref 30.0–36.0)
MCV: 89.2 fL (ref 78.0–100.0)
MONOS PCT: 8 %
Monocytes Absolute: 1 10*3/uL (ref 0.1–1.0)
NEUTROS PCT: 71 %
Neutro Abs: 8.6 10*3/uL — ABNORMAL HIGH (ref 1.7–7.7)
Platelets: 189 10*3/uL (ref 150–400)
RBC: 3.53 MIL/uL — ABNORMAL LOW (ref 3.87–5.11)
RDW: 13.8 % (ref 11.5–15.5)
WBC: 12 10*3/uL — ABNORMAL HIGH (ref 4.0–10.5)

## 2018-05-02 LAB — COMPREHENSIVE METABOLIC PANEL
ALT: 30 U/L (ref 0–44)
AST: 36 U/L (ref 15–41)
Albumin: 2.6 g/dL — ABNORMAL LOW (ref 3.5–5.0)
Alkaline Phosphatase: 130 U/L — ABNORMAL HIGH (ref 38–126)
Anion gap: 13 (ref 5–15)
BUN: 14 mg/dL (ref 8–23)
CO2: 20 mmol/L — AB (ref 22–32)
CREATININE: 0.67 mg/dL (ref 0.44–1.00)
Calcium: 8.2 mg/dL — ABNORMAL LOW (ref 8.9–10.3)
Chloride: 94 mmol/L — ABNORMAL LOW (ref 98–111)
GFR calc Af Amer: >60 mL/min (ref >60)
GFR calc non Af Amer: >60 mL/min (ref >60)
Glucose, Bld: 89 mg/dL (ref 70–99)
Potassium: 3.9 mmol/L (ref 3.5–5.1)
SODIUM: 127 mmol/L — AB (ref 135–145)
Total Bilirubin: 1.8 mg/dL — ABNORMAL HIGH (ref 0.3–1.2)
Total Protein: 6 g/dL — ABNORMAL LOW (ref 6.5–8.1)

## 2018-05-02 LAB — PROTIME-INR
INR: 2.69
Prothrombin Time: 28.4 seconds — ABNORMAL HIGH (ref 11.4–15.2)

## 2018-05-02 LAB — SODIUM, URINE, RANDOM: SODIUM UR: 45 mmol/L

## 2018-05-02 LAB — OSMOLALITY, URINE: OSMOLALITY UR: 541 mosm/kg (ref 300–900)

## 2018-05-02 LAB — CREATININE, URINE, RANDOM: Creatinine, Urine: 72.98 mg/dL

## 2018-05-02 MED ORDER — FUROSEMIDE 10 MG/ML IJ SOLN
40.0000 mg | Freq: Two times a day (BID) | INTRAMUSCULAR | Status: DC
  Administered 2018-05-02 – 2018-05-04 (×4): 40 mg via INTRAVENOUS
  Filled 2018-05-02 (×4): qty 4

## 2018-05-02 MED ORDER — WARFARIN SODIUM 4 MG PO TABS
4.0000 mg | ORAL_TABLET | Freq: Once | ORAL | Status: AC
  Administered 2018-05-02: 4 mg via ORAL
  Filled 2018-05-02: qty 1

## 2018-05-02 MED ORDER — ONDANSETRON HCL 4 MG/2ML IJ SOLN
4.0000 mg | Freq: Four times a day (QID) | INTRAMUSCULAR | Status: DC
  Administered 2018-05-05: 4 mg via INTRAVENOUS
  Filled 2018-05-02 (×3): qty 2

## 2018-05-02 NOTE — Plan of Care (Signed)
  Problem: Education: Goal: Knowledge of General Education information will improve Description: Including pain rating scale, medication(s)/side effects and non-pharmacologic comfort measures Outcome: Progressing   Problem: Clinical Measurements: Goal: Ability to maintain clinical measurements within normal limits will improve Outcome: Progressing   Problem: Activity: Goal: Risk for activity intolerance will decrease Outcome: Progressing   Problem: Safety: Goal: Ability to remain free from injury will improve Outcome: Progressing   Problem: Skin Integrity: Goal: Risk for impaired skin integrity will decrease Outcome: Progressing   
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
## 2018-05-02 NOTE — Progress Notes (Signed)
MD is informed about recent CT scan results.

## 2018-05-03 ENCOUNTER — Inpatient Hospital Stay (HOSPITAL_COMMUNITY): Payer: Medicare HMO

## 2018-05-03 DIAGNOSIS — E785 Hyperlipidemia, unspecified: Secondary | ICD-10-CM

## 2018-05-03 DIAGNOSIS — S52502D Unspecified fracture of the lower end of left radius, subsequent encounter for closed fracture with routine healing: Secondary | ICD-10-CM

## 2018-05-03 LAB — COMPREHENSIVE METABOLIC PANEL
ALBUMIN: 2.6 g/dL — AB (ref 3.5–5.0)
ALT: 37 U/L (ref 0–44)
ANION GAP: 12 (ref 5–15)
AST: 50 U/L — AB (ref 15–41)
Alkaline Phosphatase: 138 U/L — ABNORMAL HIGH (ref 38–126)
BUN: 12 mg/dL (ref 8–23)
CHLORIDE: 93 mmol/L — AB (ref 98–111)
CO2: 22 mmol/L (ref 22–32)
Calcium: 8.4 mg/dL — ABNORMAL LOW (ref 8.9–10.3)
Creatinine, Ser: 0.61 mg/dL (ref 0.44–1.00)
GFR calc Af Amer: >60 mL/min (ref >60)
GFR calc non Af Amer: >60 mL/min (ref >60)
Glucose, Bld: 116 mg/dL — ABNORMAL HIGH (ref 70–99)
POTASSIUM: 4 mmol/L (ref 3.5–5.1)
Sodium: 127 mmol/L — ABNORMAL LOW (ref 135–145)
Total Bilirubin: 1.6 mg/dL — ABNORMAL HIGH (ref 0.3–1.2)
Total Protein: 6.3 g/dL — ABNORMAL LOW (ref 6.5–8.1)

## 2018-05-03 LAB — CBC WITH DIFFERENTIAL/PLATELET
Abs Immature Granulocytes: 0.3 10*3/uL — ABNORMAL HIGH (ref 0.0–0.1)
BASOS PCT: 0 %
Basophils Absolute: 0 10*3/uL (ref 0.0–0.1)
Eosinophils Absolute: 0.3 10*3/uL (ref 0.0–0.7)
Eosinophils Relative: 3 %
HCT: 30.6 % — ABNORMAL LOW (ref 36.0–46.0)
Hemoglobin: 10.3 g/dL — ABNORMAL LOW (ref 12.0–15.0)
IMMATURE GRANULOCYTES: 3 %
Lymphocytes Relative: 18 %
Lymphs Abs: 1.7 10*3/uL (ref 0.7–4.0)
MCH: 29.9 pg (ref 26.0–34.0)
MCHC: 33.7 g/dL (ref 30.0–36.0)
MCV: 88.7 fL (ref 78.0–100.0)
Monocytes Absolute: 0.9 10*3/uL (ref 0.1–1.0)
Monocytes Relative: 9 %
Neutro Abs: 6.3 10*3/uL (ref 1.7–7.7)
Neutrophils Relative %: 67 %
PLATELETS: 237 10*3/uL (ref 150–400)
RBC: 3.45 MIL/uL — AB (ref 3.87–5.11)
RDW: 13.7 % (ref 11.5–15.5)
WBC: 9.5 10*3/uL (ref 4.0–10.5)

## 2018-05-03 LAB — UREA NITROGEN, URINE: Urea Nitrogen, Ur: 843 mg/dL

## 2018-05-03 LAB — PROTIME-INR
INR: 2.15
PROTHROMBIN TIME: 23.8 s — AB (ref 11.4–15.2)

## 2018-05-03 MED ORDER — WARFARIN SODIUM 4 MG PO TABS
4.0000 mg | ORAL_TABLET | Freq: Once | ORAL | Status: AC
  Administered 2018-05-03: 4 mg via ORAL
  Filled 2018-05-03: qty 1
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
## 2018-05-03 NOTE — Plan of Care (Signed)
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
## 2018-05-03 NOTE — Plan of Care (Signed)
  Problem: Education: Goal: Knowledge of General Education information will improve Description: Including pain rating scale, medication(s)/side effects and non-pharmacologic comfort measures Outcome: Progressing   Problem: Safety: Goal: Ability to remain free from injury will improve Outcome: Progressing   Problem: Skin Integrity: Goal: Risk for impaired skin integrity will decrease Outcome: Progressing   
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
## 2018-05-04 LAB — PROTIME-INR
INR: 1.7
Prothrombin Time: 19.9 seconds — ABNORMAL HIGH (ref 11.4–15.2)

## 2018-05-04 LAB — BASIC METABOLIC PANEL
Anion gap: 14 (ref 5–15)
BUN: 19 mg/dL (ref 8–23)
CHLORIDE: 92 mmol/L — AB (ref 98–111)
CO2: 22 mmol/L (ref 22–32)
CREATININE: 0.95 mg/dL (ref 0.44–1.00)
Calcium: 8.4 mg/dL — ABNORMAL LOW (ref 8.9–10.3)
GFR calc Af Amer: >60 mL/min (ref >60)
Glucose, Bld: 104 mg/dL — ABNORMAL HIGH (ref 70–99)
Potassium: 4.1 mmol/L (ref 3.5–5.1)
SODIUM: 128 mmol/L — AB (ref 135–145)

## 2018-05-04 MED ORDER — PRAVASTATIN SODIUM 40 MG PO TABS
40.0000 mg | ORAL_TABLET | Freq: Every day | ORAL | Status: DC
  Administered 2018-05-04: 40 mg via ORAL
  Filled 2018-05-04: qty 1

## 2018-05-04 MED ORDER — CYCLOBENZAPRINE HCL 10 MG PO TABS
5.0000 mg | ORAL_TABLET | Freq: Three times a day (TID) | ORAL | Status: DC | PRN
  Filled 2018-05-04: qty 1

## 2018-05-04 MED ORDER — WARFARIN SODIUM 5 MG PO TABS
5.0000 mg | ORAL_TABLET | Freq: Once | ORAL | Status: AC
  Administered 2018-05-04: 5 mg via ORAL
  Filled 2018-05-04: qty 1

## 2018-05-04 MED ORDER — MAGNESIUM OXIDE 400 (241.3 MG) MG PO TABS
400.0000 mg | ORAL_TABLET | Freq: Two times a day (BID) | ORAL | Status: DC
  Filled 2018-05-04 (×2): qty 1
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
## 2018-05-04 NOTE — Progress Notes (Signed)
Wrong value put in
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
## 2018-05-04 NOTE — Progress Notes (Signed)
Error was put in

## 2018-05-04 NOTE — Social Work (Signed)
Pt insurance authorization received, pt able to [facility] tomorrow, 8/9. MD paged with this information.   Alexander Mt, [place] Work [phone]

## 2018-05-04 NOTE — Progress Notes (Signed)
Pt specific about her medications and refused many, RN educated pt and still did not want. Asked for her pravastatin to be restarted and RN asked MD and he gave verbal order for 40mg  at bedtime.

## 2018-05-05 DIAGNOSIS — I10 Essential (primary) hypertension: Secondary | ICD-10-CM | POA: Diagnosis not present

## 2018-05-05 DIAGNOSIS — S62102A Fracture of unspecified carpal bone, left wrist, initial encounter for closed fracture: Secondary | ICD-10-CM | POA: Diagnosis not present

## 2018-05-05 DIAGNOSIS — D500 Iron deficiency anemia secondary to blood loss (chronic): Secondary | ICD-10-CM | POA: Diagnosis not present

## 2018-05-05 DIAGNOSIS — S52502D Unspecified fracture of the lower end of left radius, subsequent encounter for closed fracture with routine healing: Secondary | ICD-10-CM | POA: Diagnosis not present

## 2018-05-05 DIAGNOSIS — S82001D Unspecified fracture of right patella, subsequent encounter for closed fracture with routine healing: Secondary | ICD-10-CM | POA: Diagnosis not present

## 2018-05-05 DIAGNOSIS — S2249XD Multiple fractures of ribs, unspecified side, subsequent encounter for fracture with routine healing: Secondary | ICD-10-CM | POA: Diagnosis not present

## 2018-05-05 DIAGNOSIS — S52302D Unspecified fracture of shaft of left radius, subsequent encounter for closed fracture with routine healing: Secondary | ICD-10-CM | POA: Diagnosis not present

## 2018-05-05 DIAGNOSIS — R262 Difficulty in walking, not elsewhere classified: Secondary | ICD-10-CM | POA: Diagnosis not present

## 2018-05-05 DIAGNOSIS — Z743 Need for continuous supervision: Secondary | ICD-10-CM | POA: Diagnosis not present

## 2018-05-05 DIAGNOSIS — R279 Unspecified lack of coordination: Secondary | ICD-10-CM | POA: Diagnosis not present

## 2018-05-05 DIAGNOSIS — E785 Hyperlipidemia, unspecified: Secondary | ICD-10-CM | POA: Diagnosis not present

## 2018-05-05 DIAGNOSIS — S2249XA Multiple fractures of ribs, unspecified side, initial encounter for closed fracture: Secondary | ICD-10-CM | POA: Diagnosis not present

## 2018-05-05 DIAGNOSIS — R55 Syncope and collapse: Secondary | ICD-10-CM | POA: Diagnosis not present

## 2018-05-05 DIAGNOSIS — M6281 Muscle weakness (generalized): Secondary | ICD-10-CM | POA: Diagnosis not present

## 2018-05-05 DIAGNOSIS — I4891 Unspecified atrial fibrillation: Secondary | ICD-10-CM | POA: Diagnosis not present

## 2018-05-05 LAB — PROTIME-INR
INR: 1.9
Prothrombin Time: 21.7 seconds — ABNORMAL HIGH (ref 11.4–15.2)

## 2018-05-05 MED ORDER — ACETAMINOPHEN 500 MG PO TABS: 500.0000 mg | ORAL_TABLET | Freq: Three times a day (TID) | ORAL | 0 refills | Status: AC | PRN

## 2018-05-05 MED ORDER — WARFARIN SODIUM 6 MG PO TABS
6.0000 mg | ORAL_TABLET | Freq: Once | ORAL | Status: AC
  Administered 2018-05-05: 6 mg via ORAL
  Filled 2018-05-05: qty 1

## 2018-05-05 MED ORDER — CYCLOBENZAPRINE HCL 5 MG PO TABS: 5.0000 mg | ORAL_TABLET | Freq: Three times a day (TID) | ORAL | 0 refills | Status: AC | PRN

## 2018-05-05 MED ORDER — DOCUSATE SODIUM 100 MG PO CAPS: 100.0000 mg | ORAL_CAPSULE | Freq: Every day | ORAL | 0 refills | Status: AC

## 2018-05-05 MED ORDER — TRAMADOL HCL 50 MG PO TABS: 100.0000 mg | ORAL_TABLET | Freq: Three times a day (TID) | ORAL | 0 refills | Status: AC | PRN

## 2018-05-05 MED ORDER — FAMOTIDINE 20 MG PO TABS: 20.0000 mg | ORAL_TABLET | Freq: Every day | ORAL | Status: AC
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
## 2018-05-05 NOTE — Clinical Social Work Placement (Signed)
   CLINICAL SOCIAL WORK PLACEMENT  NOTE  Date:  05/05/2018  Patient Details  Name: [facility] MRN: [id number] Date of Birth: 21-Dec-1950  Clinical Social Work is seeking post-discharge placement for this patient at the [place] level of care (*CSW will initial, date and re-position this form in  chart as items are completed):  Yes   Patient/family provided with [facility] Work Department's list of facilities offering this level of care within the geographic area requested by the patient (or if unable, by the patient's family).  Yes   Patient/family informed of their freedom to choose among providers that offer the needed level of care, that participate in Medicare, Medicaid or managed care program needed by the patient, have an available bed and are willing to accept the patient.  Yes   Patient/family informed of [facility]'s ownership interest in [facility] and [facility], as well as of the fact that they are under no obligation to receive care at these facilities.  PASRR submitted to EDS on 04/25/18     PASRR number received on 04/25/18     Existing PASRR number confirmed on       FL2 transmitted to all facilities in geographic area requested by pt/family on 04/25/18     FL2 transmitted to all facilities within larger geographic area on       Patient informed that his/her managed care company has contracts with or will negotiate with certain facilities, including the following:        Yes   Patient/family informed of bed offers received.  Patient chooses bed at [facility], [facility]     Physician recommends and patient chooses bed at      Patient to be transferred to [facility], [facility] on 05/05/18.  Patient to be transferred to facility by [facility]     Patient family notified on 05/05/18 of transfer.  Name of family member notified:  REgina     PHYSICIAN       Additional Comment:     _______________________________________________ Eileen Stanford, LCSW 05/05/2018, 4:14 PM
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
## 2018-05-05 NOTE — Clinical Social Work Note (Signed)
Clinical Social Worker facilitated patient discharge including contacting patient family and facility to confirm patient discharge plans.  Clinical information faxed to facility and family agreeable with plan.  CSW arranged ambulance transport via [facility] (5:00 PM) to [facility] in [place] .  RN to call [phone] for report prior to discharge.  Clinical Social Worker will sign off for now as social work intervention is no longer needed. Please consult [place] again if new need arises.  Loletha Grayer, MSW [phone]
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
## 2018-05-05 NOTE — Progress Notes (Signed)
Report called to [facility] (nurse) at the [facility] facility.  Written discharge paperwork will be sent via [facility] for transportation

## 2018-05-06 NOTE — Progress Notes (Signed)
Pt. D/C via stretcher by EMS/[facility]- going to [facility] in [place]; pt. w/o c/o's.

## 2018-05-08 DIAGNOSIS — I4891 Unspecified atrial fibrillation: Secondary | ICD-10-CM | POA: Diagnosis not present

## 2018-05-08 DIAGNOSIS — R55 Syncope and collapse: Secondary | ICD-10-CM | POA: Diagnosis not present

## 2018-05-08 DIAGNOSIS — S2249XD Multiple fractures of ribs, unspecified side, subsequent encounter for fracture with routine healing: Secondary | ICD-10-CM | POA: Diagnosis not present

## 2018-05-08 DIAGNOSIS — I10 Essential (primary) hypertension: Secondary | ICD-10-CM | POA: Diagnosis not present

## 2018-05-11 DIAGNOSIS — E782 Mixed hyperlipidemia: Secondary | ICD-10-CM | POA: Diagnosis not present

## 2018-05-11 DIAGNOSIS — D649 Anemia, unspecified: Secondary | ICD-10-CM | POA: Diagnosis not present

## 2018-05-11 DIAGNOSIS — S82001D Unspecified fracture of right patella, subsequent encounter for closed fracture with routine healing: Secondary | ICD-10-CM | POA: Diagnosis not present

## 2018-05-11 DIAGNOSIS — S82002D Unspecified fracture of left patella, subsequent encounter for closed fracture with routine healing: Secondary | ICD-10-CM | POA: Diagnosis not present

## 2018-05-11 DIAGNOSIS — S2243XD Multiple fractures of ribs, bilateral, subsequent encounter for fracture with routine healing: Secondary | ICD-10-CM | POA: Diagnosis not present

## 2018-05-11 DIAGNOSIS — S2241XS Multiple fractures of ribs, right side, sequela: Secondary | ICD-10-CM | POA: Diagnosis not present

## 2018-05-11 DIAGNOSIS — M6281 Muscle weakness (generalized): Secondary | ICD-10-CM | POA: Diagnosis not present

## 2018-05-11 DIAGNOSIS — I4891 Unspecified atrial fibrillation: Secondary | ICD-10-CM | POA: Diagnosis not present

## 2018-05-11 DIAGNOSIS — E871 Hypo-osmolality and hyponatremia: Secondary | ICD-10-CM | POA: Diagnosis not present

## 2018-05-11 DIAGNOSIS — S52572D Other intraarticular fracture of lower end of left radius, subsequent encounter for closed fracture with routine healing: Secondary | ICD-10-CM | POA: Diagnosis not present

## 2018-05-11 DIAGNOSIS — S52562D Barton's fracture of left radius, subsequent encounter for closed fracture with routine healing: Secondary | ICD-10-CM | POA: Diagnosis not present

## 2018-05-11 DIAGNOSIS — G8928 Other chronic postprocedural pain: Secondary | ICD-10-CM | POA: Diagnosis not present

## 2018-05-11 DIAGNOSIS — S52502P Unspecified fracture of the lower end of left radius, subsequent encounter for closed fracture with malunion: Secondary | ICD-10-CM | POA: Diagnosis not present

## 2018-05-11 DIAGNOSIS — S82001A Unspecified fracture of right patella, initial encounter for closed fracture: Secondary | ICD-10-CM | POA: Diagnosis not present

## 2018-05-11 DIAGNOSIS — S2249XA Multiple fractures of ribs, unspecified side, initial encounter for closed fracture: Secondary | ICD-10-CM | POA: Diagnosis not present

## 2018-05-11 DIAGNOSIS — I10 Essential (primary) hypertension: Secondary | ICD-10-CM | POA: Diagnosis not present

## 2018-05-11 DIAGNOSIS — I482 Chronic atrial fibrillation: Secondary | ICD-10-CM | POA: Diagnosis not present

## 2018-05-17 DIAGNOSIS — S52562D Barton's fracture of left radius, subsequent encounter for closed fracture with routine healing: Secondary | ICD-10-CM | POA: Diagnosis not present

## 2018-05-18 ENCOUNTER — Other Ambulatory Visit: Payer: Self-pay | Admitting: *Deleted
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
## 2018-05-19 DIAGNOSIS — I4891 Unspecified atrial fibrillation: Secondary | ICD-10-CM | POA: Diagnosis not present

## 2018-05-19 DIAGNOSIS — I10 Essential (primary) hypertension: Secondary | ICD-10-CM | POA: Diagnosis not present

## 2018-05-19 DIAGNOSIS — S82002D Unspecified fracture of left patella, subsequent encounter for closed fracture with routine healing: Secondary | ICD-10-CM | POA: Diagnosis not present

## 2018-05-19 DIAGNOSIS — S52502P Unspecified fracture of the lower end of left radius, subsequent encounter for closed fracture with malunion: Secondary | ICD-10-CM | POA: Diagnosis not present

## 2018-05-22 ENCOUNTER — Ambulatory Visit: Payer: Self-pay | Admitting: *Deleted

## 2018-05-23 ENCOUNTER — Other Ambulatory Visit: Payer: Self-pay | Admitting: *Deleted

## 2018-05-23 NOTE — Patient Outreach (Signed)
[facility]) Care Management  05/23/2018  Nancy Thomas 01-02-1951 [id number]   Opened in error

## 2018-05-30 DIAGNOSIS — E782 Mixed hyperlipidemia: Secondary | ICD-10-CM | POA: Diagnosis not present

## 2018-05-30 DIAGNOSIS — I10 Essential (primary) hypertension: Secondary | ICD-10-CM | POA: Diagnosis not present

## 2018-05-30 DIAGNOSIS — E871 Hypo-osmolality and hyponatremia: Secondary | ICD-10-CM | POA: Diagnosis not present

## 2018-05-30 DIAGNOSIS — I4891 Unspecified atrial fibrillation: Secondary | ICD-10-CM | POA: Diagnosis not present

## 2018-06-09 DIAGNOSIS — I4891 Unspecified atrial fibrillation: Secondary | ICD-10-CM | POA: Diagnosis not present

## 2018-06-09 DIAGNOSIS — S2241XS Multiple fractures of ribs, right side, sequela: Secondary | ICD-10-CM | POA: Diagnosis not present

## 2018-06-09 DIAGNOSIS — S52502P Unspecified fracture of the lower end of left radius, subsequent encounter for closed fracture with malunion: Secondary | ICD-10-CM | POA: Diagnosis not present

## 2018-06-09 DIAGNOSIS — S82002D Unspecified fracture of left patella, subsequent encounter for closed fracture with routine healing: Secondary | ICD-10-CM | POA: Diagnosis not present

## 2018-06-11 DIAGNOSIS — S82001A Unspecified fracture of right patella, initial encounter for closed fracture: Secondary | ICD-10-CM | POA: Diagnosis not present

## 2018-06-11 DIAGNOSIS — S2243XD Multiple fractures of ribs, bilateral, subsequent encounter for fracture with routine healing: Secondary | ICD-10-CM | POA: Diagnosis not present

## 2018-06-11 DIAGNOSIS — S52572D Other intraarticular fracture of lower end of left radius, subsequent encounter for closed fracture with routine healing: Secondary | ICD-10-CM | POA: Diagnosis not present

## 2018-06-11 DIAGNOSIS — M6281 Muscle weakness (generalized): Secondary | ICD-10-CM | POA: Diagnosis not present

## 2018-06-11 DIAGNOSIS — S2249XA Multiple fractures of ribs, unspecified side, initial encounter for closed fracture: Secondary | ICD-10-CM | POA: Diagnosis not present

## 2018-06-13 DIAGNOSIS — S82001A Unspecified fracture of right patella, initial encounter for closed fracture: Secondary | ICD-10-CM | POA: Diagnosis not present

## 2018-06-13 DIAGNOSIS — M6281 Muscle weakness (generalized): Secondary | ICD-10-CM | POA: Diagnosis not present

## 2018-06-13 DIAGNOSIS — S2243XD Multiple fractures of ribs, bilateral, subsequent encounter for fracture with routine healing: Secondary | ICD-10-CM | POA: Diagnosis not present

## 2018-06-13 DIAGNOSIS — S52572D Other intraarticular fracture of lower end of left radius, subsequent encounter for closed fracture with routine healing: Secondary | ICD-10-CM | POA: Diagnosis not present

## 2018-06-13 DIAGNOSIS — S2249XA Multiple fractures of ribs, unspecified side, initial encounter for closed fracture: Secondary | ICD-10-CM | POA: Diagnosis not present

## 2018-06-14 DIAGNOSIS — S52562D Barton's fracture of left radius, subsequent encounter for closed fracture with routine healing: Secondary | ICD-10-CM | POA: Diagnosis not present

## 2018-06-21 DIAGNOSIS — S5292XD Unspecified fracture of left forearm, subsequent encounter for closed fracture with routine healing: Secondary | ICD-10-CM | POA: Diagnosis not present

## 2018-06-21 DIAGNOSIS — Z7901 Long term (current) use of anticoagulants: Secondary | ICD-10-CM | POA: Diagnosis not present

## 2018-06-21 DIAGNOSIS — R609 Edema, unspecified: Secondary | ICD-10-CM | POA: Diagnosis not present

## 2018-06-21 DIAGNOSIS — S82001P Unspecified fracture of right patella, subsequent encounter for closed fracture with malunion: Secondary | ICD-10-CM | POA: Diagnosis not present

## 2018-06-21 DIAGNOSIS — M25562 Pain in left knee: Secondary | ICD-10-CM | POA: Diagnosis not present

## 2018-06-21 DIAGNOSIS — E871 Hypo-osmolality and hyponatremia: Secondary | ICD-10-CM | POA: Diagnosis not present

## 2018-06-21 DIAGNOSIS — Z9889 Other specified postprocedural states: Secondary | ICD-10-CM | POA: Diagnosis not present

## 2018-06-21 DIAGNOSIS — I4891 Unspecified atrial fibrillation: Secondary | ICD-10-CM | POA: Diagnosis not present

## 2018-06-28 DIAGNOSIS — Z7901 Long term (current) use of anticoagulants: Secondary | ICD-10-CM | POA: Diagnosis not present

## 2018-06-28 DIAGNOSIS — S2241XD Multiple fractures of ribs, right side, subsequent encounter for fracture with routine healing: Secondary | ICD-10-CM | POA: Diagnosis not present

## 2018-06-28 DIAGNOSIS — R600 Localized edema: Secondary | ICD-10-CM | POA: Diagnosis not present

## 2018-06-28 DIAGNOSIS — I482 Chronic atrial fibrillation, unspecified: Secondary | ICD-10-CM | POA: Diagnosis not present

## 2018-06-28 DIAGNOSIS — Z5181 Encounter for therapeutic drug level monitoring: Secondary | ICD-10-CM | POA: Diagnosis not present

## 2018-06-28 DIAGNOSIS — E669 Obesity, unspecified: Secondary | ICD-10-CM | POA: Diagnosis not present

## 2018-06-28 DIAGNOSIS — S5292XD Unspecified fracture of left forearm, subsequent encounter for closed fracture with routine healing: Secondary | ICD-10-CM | POA: Diagnosis not present

## 2018-06-28 DIAGNOSIS — S82014P Nondisplaced osteochondral fracture of right patella, subsequent encounter for closed fracture with malunion: Secondary | ICD-10-CM | POA: Diagnosis not present

## 2018-06-28 DIAGNOSIS — E871 Hypo-osmolality and hyponatremia: Secondary | ICD-10-CM | POA: Diagnosis not present

## 2018-06-28 DIAGNOSIS — I10 Essential (primary) hypertension: Secondary | ICD-10-CM | POA: Diagnosis not present

## 2018-06-28 DIAGNOSIS — M25562 Pain in left knee: Secondary | ICD-10-CM | POA: Diagnosis not present

## 2018-06-29 DIAGNOSIS — M25562 Pain in left knee: Secondary | ICD-10-CM | POA: Diagnosis not present

## 2018-06-29 DIAGNOSIS — S82014P Nondisplaced osteochondral fracture of right patella, subsequent encounter for closed fracture with malunion: Secondary | ICD-10-CM | POA: Diagnosis not present

## 2018-06-29 DIAGNOSIS — R600 Localized edema: Secondary | ICD-10-CM | POA: Diagnosis not present

## 2018-06-29 DIAGNOSIS — E669 Obesity, unspecified: Secondary | ICD-10-CM | POA: Diagnosis not present

## 2018-06-29 DIAGNOSIS — E871 Hypo-osmolality and hyponatremia: Secondary | ICD-10-CM | POA: Diagnosis not present

## 2018-06-29 DIAGNOSIS — S5292XD Unspecified fracture of left forearm, subsequent encounter for closed fracture with routine healing: Secondary | ICD-10-CM | POA: Diagnosis not present

## 2018-06-29 DIAGNOSIS — I10 Essential (primary) hypertension: Secondary | ICD-10-CM | POA: Diagnosis not present

## 2018-06-29 DIAGNOSIS — S2241XD Multiple fractures of ribs, right side, subsequent encounter for fracture with routine healing: Secondary | ICD-10-CM | POA: Diagnosis not present

## 2018-06-29 DIAGNOSIS — I482 Chronic atrial fibrillation, unspecified: Secondary | ICD-10-CM | POA: Diagnosis not present

## 2018-07-03 DIAGNOSIS — M25562 Pain in left knee: Secondary | ICD-10-CM | POA: Diagnosis not present

## 2018-07-03 DIAGNOSIS — E669 Obesity, unspecified: Secondary | ICD-10-CM | POA: Diagnosis not present

## 2018-07-03 DIAGNOSIS — I482 Chronic atrial fibrillation, unspecified: Secondary | ICD-10-CM | POA: Diagnosis not present

## 2018-07-03 DIAGNOSIS — I10 Essential (primary) hypertension: Secondary | ICD-10-CM | POA: Diagnosis not present

## 2018-07-03 DIAGNOSIS — E871 Hypo-osmolality and hyponatremia: Secondary | ICD-10-CM | POA: Diagnosis not present

## 2018-07-03 DIAGNOSIS — S2241XD Multiple fractures of ribs, right side, subsequent encounter for fracture with routine healing: Secondary | ICD-10-CM | POA: Diagnosis not present

## 2018-07-03 DIAGNOSIS — R600 Localized edema: Secondary | ICD-10-CM | POA: Diagnosis not present

## 2018-07-03 DIAGNOSIS — S5292XD Unspecified fracture of left forearm, subsequent encounter for closed fracture with routine healing: Secondary | ICD-10-CM | POA: Diagnosis not present

## 2018-07-03 DIAGNOSIS — S82014P Nondisplaced osteochondral fracture of right patella, subsequent encounter for closed fracture with malunion: Secondary | ICD-10-CM | POA: Diagnosis not present

## 2018-07-05 DIAGNOSIS — S82014P Nondisplaced osteochondral fracture of right patella, subsequent encounter for closed fracture with malunion: Secondary | ICD-10-CM | POA: Diagnosis not present

## 2018-07-05 DIAGNOSIS — E669 Obesity, unspecified: Secondary | ICD-10-CM | POA: Diagnosis not present

## 2018-07-05 DIAGNOSIS — E871 Hypo-osmolality and hyponatremia: Secondary | ICD-10-CM | POA: Diagnosis not present

## 2018-07-05 DIAGNOSIS — I10 Essential (primary) hypertension: Secondary | ICD-10-CM | POA: Diagnosis not present

## 2018-07-05 DIAGNOSIS — R600 Localized edema: Secondary | ICD-10-CM | POA: Diagnosis not present

## 2018-07-05 DIAGNOSIS — M25562 Pain in left knee: Secondary | ICD-10-CM | POA: Diagnosis not present

## 2018-07-05 DIAGNOSIS — S5292XD Unspecified fracture of left forearm, subsequent encounter for closed fracture with routine healing: Secondary | ICD-10-CM | POA: Diagnosis not present

## 2018-07-05 DIAGNOSIS — S2241XD Multiple fractures of ribs, right side, subsequent encounter for fracture with routine healing: Secondary | ICD-10-CM | POA: Diagnosis not present

## 2018-07-05 DIAGNOSIS — I482 Chronic atrial fibrillation, unspecified: Secondary | ICD-10-CM | POA: Diagnosis not present

## 2018-07-06 DIAGNOSIS — E669 Obesity, unspecified: Secondary | ICD-10-CM | POA: Diagnosis not present

## 2018-07-06 DIAGNOSIS — E871 Hypo-osmolality and hyponatremia: Secondary | ICD-10-CM | POA: Diagnosis not present

## 2018-07-06 DIAGNOSIS — S82014P Nondisplaced osteochondral fracture of right patella, subsequent encounter for closed fracture with malunion: Secondary | ICD-10-CM | POA: Diagnosis not present

## 2018-07-06 DIAGNOSIS — I482 Chronic atrial fibrillation, unspecified: Secondary | ICD-10-CM | POA: Diagnosis not present

## 2018-07-06 DIAGNOSIS — I10 Essential (primary) hypertension: Secondary | ICD-10-CM | POA: Diagnosis not present

## 2018-07-06 DIAGNOSIS — S2241XD Multiple fractures of ribs, right side, subsequent encounter for fracture with routine healing: Secondary | ICD-10-CM | POA: Diagnosis not present

## 2018-07-06 DIAGNOSIS — M25562 Pain in left knee: Secondary | ICD-10-CM | POA: Diagnosis not present

## 2018-07-06 DIAGNOSIS — S5292XD Unspecified fracture of left forearm, subsequent encounter for closed fracture with routine healing: Secondary | ICD-10-CM | POA: Diagnosis not present

## 2018-07-06 DIAGNOSIS — R600 Localized edema: Secondary | ICD-10-CM | POA: Diagnosis not present

## 2018-07-09 DIAGNOSIS — S52572D Other intraarticular fracture of lower end of left radius, subsequent encounter for closed fracture with routine healing: Secondary | ICD-10-CM | POA: Diagnosis not present

## 2018-07-09 DIAGNOSIS — S82001A Unspecified fracture of right patella, initial encounter for closed fracture: Secondary | ICD-10-CM | POA: Diagnosis not present

## 2018-07-09 DIAGNOSIS — M6281 Muscle weakness (generalized): Secondary | ICD-10-CM | POA: Diagnosis not present

## 2018-07-09 DIAGNOSIS — S2243XD Multiple fractures of ribs, bilateral, subsequent encounter for fracture with routine healing: Secondary | ICD-10-CM | POA: Diagnosis not present

## 2018-07-09 DIAGNOSIS — S2249XA Multiple fractures of ribs, unspecified side, initial encounter for closed fracture: Secondary | ICD-10-CM | POA: Diagnosis not present

## 2018-07-10 DIAGNOSIS — E871 Hypo-osmolality and hyponatremia: Secondary | ICD-10-CM | POA: Diagnosis not present

## 2018-07-10 DIAGNOSIS — S2241XD Multiple fractures of ribs, right side, subsequent encounter for fracture with routine healing: Secondary | ICD-10-CM | POA: Diagnosis not present

## 2018-07-10 DIAGNOSIS — S82014P Nondisplaced osteochondral fracture of right patella, subsequent encounter for closed fracture with malunion: Secondary | ICD-10-CM | POA: Diagnosis not present

## 2018-07-10 DIAGNOSIS — M25562 Pain in left knee: Secondary | ICD-10-CM | POA: Diagnosis not present

## 2018-07-10 DIAGNOSIS — I482 Chronic atrial fibrillation, unspecified: Secondary | ICD-10-CM | POA: Diagnosis not present

## 2018-07-10 DIAGNOSIS — R600 Localized edema: Secondary | ICD-10-CM | POA: Diagnosis not present

## 2018-07-10 DIAGNOSIS — I10 Essential (primary) hypertension: Secondary | ICD-10-CM | POA: Diagnosis not present

## 2018-07-10 DIAGNOSIS — E669 Obesity, unspecified: Secondary | ICD-10-CM | POA: Diagnosis not present

## 2018-07-10 DIAGNOSIS — S5292XD Unspecified fracture of left forearm, subsequent encounter for closed fracture with routine healing: Secondary | ICD-10-CM | POA: Diagnosis not present

## 2018-07-12 DIAGNOSIS — E669 Obesity, unspecified: Secondary | ICD-10-CM | POA: Diagnosis not present

## 2018-07-12 DIAGNOSIS — S82014P Nondisplaced osteochondral fracture of right patella, subsequent encounter for closed fracture with malunion: Secondary | ICD-10-CM | POA: Diagnosis not present

## 2018-07-12 DIAGNOSIS — I482 Chronic atrial fibrillation, unspecified: Secondary | ICD-10-CM | POA: Diagnosis not present

## 2018-07-12 DIAGNOSIS — S2241XD Multiple fractures of ribs, right side, subsequent encounter for fracture with routine healing: Secondary | ICD-10-CM | POA: Diagnosis not present

## 2018-07-12 DIAGNOSIS — M25562 Pain in left knee: Secondary | ICD-10-CM | POA: Diagnosis not present

## 2018-07-12 DIAGNOSIS — R600 Localized edema: Secondary | ICD-10-CM | POA: Diagnosis not present

## 2018-07-12 DIAGNOSIS — I10 Essential (primary) hypertension: Secondary | ICD-10-CM | POA: Diagnosis not present

## 2018-07-12 DIAGNOSIS — S52562D Barton's fracture of left radius, subsequent encounter for closed fracture with routine healing: Secondary | ICD-10-CM | POA: Diagnosis not present

## 2018-07-12 DIAGNOSIS — E871 Hypo-osmolality and hyponatremia: Secondary | ICD-10-CM | POA: Diagnosis not present

## 2018-07-12 DIAGNOSIS — S5292XD Unspecified fracture of left forearm, subsequent encounter for closed fracture with routine healing: Secondary | ICD-10-CM | POA: Diagnosis not present

## 2018-07-14 DIAGNOSIS — R600 Localized edema: Secondary | ICD-10-CM | POA: Diagnosis not present

## 2018-07-14 DIAGNOSIS — I482 Chronic atrial fibrillation, unspecified: Secondary | ICD-10-CM | POA: Diagnosis not present

## 2018-07-14 DIAGNOSIS — S2241XD Multiple fractures of ribs, right side, subsequent encounter for fracture with routine healing: Secondary | ICD-10-CM | POA: Diagnosis not present

## 2018-07-14 DIAGNOSIS — S82014P Nondisplaced osteochondral fracture of right patella, subsequent encounter for closed fracture with malunion: Secondary | ICD-10-CM | POA: Diagnosis not present

## 2018-07-14 DIAGNOSIS — M25562 Pain in left knee: Secondary | ICD-10-CM | POA: Diagnosis not present

## 2018-07-14 DIAGNOSIS — S5292XD Unspecified fracture of left forearm, subsequent encounter for closed fracture with routine healing: Secondary | ICD-10-CM | POA: Diagnosis not present

## 2018-07-14 DIAGNOSIS — E871 Hypo-osmolality and hyponatremia: Secondary | ICD-10-CM | POA: Diagnosis not present

## 2018-07-14 DIAGNOSIS — E669 Obesity, unspecified: Secondary | ICD-10-CM | POA: Diagnosis not present

## 2018-07-14 DIAGNOSIS — I10 Essential (primary) hypertension: Secondary | ICD-10-CM | POA: Diagnosis not present

## 2018-07-18 DIAGNOSIS — S2241XD Multiple fractures of ribs, right side, subsequent encounter for fracture with routine healing: Secondary | ICD-10-CM | POA: Diagnosis not present

## 2018-07-18 DIAGNOSIS — E871 Hypo-osmolality and hyponatremia: Secondary | ICD-10-CM | POA: Diagnosis not present

## 2018-07-18 DIAGNOSIS — S5292XD Unspecified fracture of left forearm, subsequent encounter for closed fracture with routine healing: Secondary | ICD-10-CM | POA: Diagnosis not present

## 2018-07-18 DIAGNOSIS — E669 Obesity, unspecified: Secondary | ICD-10-CM | POA: Diagnosis not present

## 2018-07-18 DIAGNOSIS — M25562 Pain in left knee: Secondary | ICD-10-CM | POA: Diagnosis not present

## 2018-07-18 DIAGNOSIS — S82014P Nondisplaced osteochondral fracture of right patella, subsequent encounter for closed fracture with malunion: Secondary | ICD-10-CM | POA: Diagnosis not present

## 2018-07-18 DIAGNOSIS — I482 Chronic atrial fibrillation, unspecified: Secondary | ICD-10-CM | POA: Diagnosis not present

## 2018-07-18 DIAGNOSIS — R600 Localized edema: Secondary | ICD-10-CM | POA: Diagnosis not present

## 2018-07-18 DIAGNOSIS — I10 Essential (primary) hypertension: Secondary | ICD-10-CM | POA: Diagnosis not present

## 2018-07-21 DIAGNOSIS — I482 Chronic atrial fibrillation, unspecified: Secondary | ICD-10-CM | POA: Diagnosis not present

## 2018-07-21 DIAGNOSIS — M25562 Pain in left knee: Secondary | ICD-10-CM | POA: Diagnosis not present

## 2018-07-21 DIAGNOSIS — E669 Obesity, unspecified: Secondary | ICD-10-CM | POA: Diagnosis not present

## 2018-07-21 DIAGNOSIS — S82014P Nondisplaced osteochondral fracture of right patella, subsequent encounter for closed fracture with malunion: Secondary | ICD-10-CM | POA: Diagnosis not present

## 2018-07-21 DIAGNOSIS — R600 Localized edema: Secondary | ICD-10-CM | POA: Diagnosis not present

## 2018-07-21 DIAGNOSIS — S5292XD Unspecified fracture of left forearm, subsequent encounter for closed fracture with routine healing: Secondary | ICD-10-CM | POA: Diagnosis not present

## 2018-07-21 DIAGNOSIS — I10 Essential (primary) hypertension: Secondary | ICD-10-CM | POA: Diagnosis not present

## 2018-07-21 DIAGNOSIS — S2241XD Multiple fractures of ribs, right side, subsequent encounter for fracture with routine healing: Secondary | ICD-10-CM | POA: Diagnosis not present

## 2018-07-21 DIAGNOSIS — E871 Hypo-osmolality and hyponatremia: Secondary | ICD-10-CM | POA: Diagnosis not present

## 2018-07-25 DIAGNOSIS — S2241XD Multiple fractures of ribs, right side, subsequent encounter for fracture with routine healing: Secondary | ICD-10-CM | POA: Diagnosis not present

## 2018-07-25 DIAGNOSIS — M25562 Pain in left knee: Secondary | ICD-10-CM | POA: Diagnosis not present

## 2018-07-25 DIAGNOSIS — E669 Obesity, unspecified: Secondary | ICD-10-CM | POA: Diagnosis not present

## 2018-07-25 DIAGNOSIS — S5292XD Unspecified fracture of left forearm, subsequent encounter for closed fracture with routine healing: Secondary | ICD-10-CM | POA: Diagnosis not present

## 2018-07-25 DIAGNOSIS — E871 Hypo-osmolality and hyponatremia: Secondary | ICD-10-CM | POA: Diagnosis not present

## 2018-07-25 DIAGNOSIS — R600 Localized edema: Secondary | ICD-10-CM | POA: Diagnosis not present

## 2018-07-25 DIAGNOSIS — I482 Chronic atrial fibrillation, unspecified: Secondary | ICD-10-CM | POA: Diagnosis not present

## 2018-07-25 DIAGNOSIS — S82014P Nondisplaced osteochondral fracture of right patella, subsequent encounter for closed fracture with malunion: Secondary | ICD-10-CM | POA: Diagnosis not present

## 2018-07-25 DIAGNOSIS — I10 Essential (primary) hypertension: Secondary | ICD-10-CM | POA: Diagnosis not present

## 2018-07-27 DIAGNOSIS — I10 Essential (primary) hypertension: Secondary | ICD-10-CM | POA: Diagnosis not present

## 2018-07-27 DIAGNOSIS — I482 Chronic atrial fibrillation, unspecified: Secondary | ICD-10-CM | POA: Diagnosis not present

## 2018-07-27 DIAGNOSIS — S82014P Nondisplaced osteochondral fracture of right patella, subsequent encounter for closed fracture with malunion: Secondary | ICD-10-CM | POA: Diagnosis not present

## 2018-07-27 DIAGNOSIS — S5292XD Unspecified fracture of left forearm, subsequent encounter for closed fracture with routine healing: Secondary | ICD-10-CM | POA: Diagnosis not present

## 2018-07-27 DIAGNOSIS — E669 Obesity, unspecified: Secondary | ICD-10-CM | POA: Diagnosis not present

## 2018-07-27 DIAGNOSIS — R600 Localized edema: Secondary | ICD-10-CM | POA: Diagnosis not present

## 2018-07-27 DIAGNOSIS — S2241XD Multiple fractures of ribs, right side, subsequent encounter for fracture with routine healing: Secondary | ICD-10-CM | POA: Diagnosis not present

## 2018-07-27 DIAGNOSIS — E871 Hypo-osmolality and hyponatremia: Secondary | ICD-10-CM | POA: Diagnosis not present

## 2018-07-27 DIAGNOSIS — M25562 Pain in left knee: Secondary | ICD-10-CM | POA: Diagnosis not present

## 2018-08-04 DIAGNOSIS — Z1231 Encounter for screening mammogram for malignant neoplasm of breast: Secondary | ICD-10-CM | POA: Diagnosis not present

## 2018-08-09 DIAGNOSIS — S82001A Unspecified fracture of right patella, initial encounter for closed fracture: Secondary | ICD-10-CM | POA: Diagnosis not present

## 2018-08-09 DIAGNOSIS — S2249XA Multiple fractures of ribs, unspecified side, initial encounter for closed fracture: Secondary | ICD-10-CM | POA: Diagnosis not present

## 2018-08-09 DIAGNOSIS — S2243XD Multiple fractures of ribs, bilateral, subsequent encounter for fracture with routine healing: Secondary | ICD-10-CM | POA: Diagnosis not present

## 2018-08-09 DIAGNOSIS — S52572D Other intraarticular fracture of lower end of left radius, subsequent encounter for closed fracture with routine healing: Secondary | ICD-10-CM | POA: Diagnosis not present

## 2018-08-09 DIAGNOSIS — M6281 Muscle weakness (generalized): Secondary | ICD-10-CM | POA: Diagnosis not present

## 2018-08-25 DIAGNOSIS — N644 Mastodynia: Secondary | ICD-10-CM | POA: Diagnosis not present

## 2018-08-25 DIAGNOSIS — R928 Other abnormal and inconclusive findings on diagnostic imaging of breast: Secondary | ICD-10-CM | POA: Diagnosis not present

## 2018-08-25 DIAGNOSIS — R922 Inconclusive mammogram: Secondary | ICD-10-CM | POA: Diagnosis not present

## 2018-09-08 DIAGNOSIS — S52572D Other intraarticular fracture of lower end of left radius, subsequent encounter for closed fracture with routine healing: Secondary | ICD-10-CM | POA: Diagnosis not present

## 2018-09-08 DIAGNOSIS — S2243XD Multiple fractures of ribs, bilateral, subsequent encounter for fracture with routine healing: Secondary | ICD-10-CM | POA: Diagnosis not present

## 2018-09-08 DIAGNOSIS — S2249XA Multiple fractures of ribs, unspecified side, initial encounter for closed fracture: Secondary | ICD-10-CM | POA: Diagnosis not present

## 2018-09-08 DIAGNOSIS — S82001A Unspecified fracture of right patella, initial encounter for closed fracture: Secondary | ICD-10-CM | POA: Diagnosis not present

## 2018-09-08 DIAGNOSIS — M6281 Muscle weakness (generalized): Secondary | ICD-10-CM | POA: Diagnosis not present

## 2018-09-12 DIAGNOSIS — I4891 Unspecified atrial fibrillation: Secondary | ICD-10-CM | POA: Diagnosis not present

## 2018-09-12 DIAGNOSIS — Z7901 Long term (current) use of anticoagulants: Secondary | ICD-10-CM | POA: Diagnosis not present

## 2018-09-26 DIAGNOSIS — Z7901 Long term (current) use of anticoagulants: Secondary | ICD-10-CM | POA: Diagnosis not present

## 2018-09-26 DIAGNOSIS — I4891 Unspecified atrial fibrillation: Secondary | ICD-10-CM | POA: Diagnosis not present

## 2018-10-09 DIAGNOSIS — S82001A Unspecified fracture of right patella, initial encounter for closed fracture: Secondary | ICD-10-CM | POA: Diagnosis not present

## 2018-10-09 DIAGNOSIS — S2249XA Multiple fractures of ribs, unspecified side, initial encounter for closed fracture: Secondary | ICD-10-CM | POA: Diagnosis not present

## 2018-10-09 DIAGNOSIS — S52572D Other intraarticular fracture of lower end of left radius, subsequent encounter for closed fracture with routine healing: Secondary | ICD-10-CM | POA: Diagnosis not present

## 2018-10-09 DIAGNOSIS — M6281 Muscle weakness (generalized): Secondary | ICD-10-CM | POA: Diagnosis not present

## 2018-10-09 DIAGNOSIS — S2243XD Multiple fractures of ribs, bilateral, subsequent encounter for fracture with routine healing: Secondary | ICD-10-CM | POA: Diagnosis not present

## 2018-10-10 DIAGNOSIS — Z7901 Long term (current) use of anticoagulants: Secondary | ICD-10-CM | POA: Diagnosis not present

## 2018-10-10 DIAGNOSIS — I4891 Unspecified atrial fibrillation: Secondary | ICD-10-CM | POA: Diagnosis not present

## 2018-11-07 DIAGNOSIS — I4891 Unspecified atrial fibrillation: Secondary | ICD-10-CM | POA: Diagnosis not present

## 2018-11-07 DIAGNOSIS — Z7901 Long term (current) use of anticoagulants: Secondary | ICD-10-CM | POA: Diagnosis not present

## 2018-11-09 DIAGNOSIS — S2243XD Multiple fractures of ribs, bilateral, subsequent encounter for fracture with routine healing: Secondary | ICD-10-CM | POA: Diagnosis not present

## 2018-11-09 DIAGNOSIS — S52572D Other intraarticular fracture of lower end of left radius, subsequent encounter for closed fracture with routine healing: Secondary | ICD-10-CM | POA: Diagnosis not present

## 2018-11-09 DIAGNOSIS — S82001A Unspecified fracture of right patella, initial encounter for closed fracture: Secondary | ICD-10-CM | POA: Diagnosis not present

## 2018-11-09 DIAGNOSIS — M6281 Muscle weakness (generalized): Secondary | ICD-10-CM | POA: Diagnosis not present

## 2018-11-09 DIAGNOSIS — S2249XA Multiple fractures of ribs, unspecified side, initial encounter for closed fracture: Secondary | ICD-10-CM | POA: Diagnosis not present

## 2018-12-05 DIAGNOSIS — I4891 Unspecified atrial fibrillation: Secondary | ICD-10-CM | POA: Diagnosis not present

## 2018-12-05 DIAGNOSIS — Z7901 Long term (current) use of anticoagulants: Secondary | ICD-10-CM | POA: Diagnosis not present

## 2018-12-08 DIAGNOSIS — S2249XA Multiple fractures of ribs, unspecified side, initial encounter for closed fracture: Secondary | ICD-10-CM | POA: Diagnosis not present

## 2018-12-08 DIAGNOSIS — S82001A Unspecified fracture of right patella, initial encounter for closed fracture: Secondary | ICD-10-CM | POA: Diagnosis not present

## 2018-12-08 DIAGNOSIS — S52572D Other intraarticular fracture of lower end of left radius, subsequent encounter for closed fracture with routine healing: Secondary | ICD-10-CM | POA: Diagnosis not present

## 2018-12-08 DIAGNOSIS — S2243XD Multiple fractures of ribs, bilateral, subsequent encounter for fracture with routine healing: Secondary | ICD-10-CM | POA: Diagnosis not present

## 2019-01-02 DIAGNOSIS — Z7901 Long term (current) use of anticoagulants: Secondary | ICD-10-CM | POA: Diagnosis not present

## 2019-01-02 DIAGNOSIS — I4891 Unspecified atrial fibrillation: Secondary | ICD-10-CM | POA: Diagnosis not present

## 2019-01-08 DIAGNOSIS — S2243XD Multiple fractures of ribs, bilateral, subsequent encounter for fracture with routine healing: Secondary | ICD-10-CM | POA: Diagnosis not present

## 2019-01-08 DIAGNOSIS — S52572D Other intraarticular fracture of lower end of left radius, subsequent encounter for closed fracture with routine healing: Secondary | ICD-10-CM | POA: Diagnosis not present

## 2019-01-08 DIAGNOSIS — S82001A Unspecified fracture of right patella, initial encounter for closed fracture: Secondary | ICD-10-CM | POA: Diagnosis not present

## 2019-01-08 DIAGNOSIS — S2249XA Multiple fractures of ribs, unspecified side, initial encounter for closed fracture: Secondary | ICD-10-CM | POA: Diagnosis not present

## 2019-01-30 DIAGNOSIS — Z7901 Long term (current) use of anticoagulants: Secondary | ICD-10-CM | POA: Diagnosis not present

## 2019-01-30 DIAGNOSIS — I4891 Unspecified atrial fibrillation: Secondary | ICD-10-CM | POA: Diagnosis not present

## 2019-02-07 DIAGNOSIS — S82001A Unspecified fracture of right patella, initial encounter for closed fracture: Secondary | ICD-10-CM | POA: Diagnosis not present

## 2019-02-07 DIAGNOSIS — S2243XD Multiple fractures of ribs, bilateral, subsequent encounter for fracture with routine healing: Secondary | ICD-10-CM | POA: Diagnosis not present

## 2019-02-07 DIAGNOSIS — S52572D Other intraarticular fracture of lower end of left radius, subsequent encounter for closed fracture with routine healing: Secondary | ICD-10-CM | POA: Diagnosis not present

## 2019-02-07 DIAGNOSIS — S2249XA Multiple fractures of ribs, unspecified side, initial encounter for closed fracture: Secondary | ICD-10-CM | POA: Diagnosis not present

## 2019-02-21 DIAGNOSIS — E782 Mixed hyperlipidemia: Secondary | ICD-10-CM | POA: Diagnosis not present

## 2019-02-21 DIAGNOSIS — Z7901 Long term (current) use of anticoagulants: Secondary | ICD-10-CM | POA: Diagnosis not present

## 2019-02-21 DIAGNOSIS — Z1211 Encounter for screening for malignant neoplasm of colon: Secondary | ICD-10-CM | POA: Diagnosis not present

## 2019-02-21 DIAGNOSIS — E1169 Type 2 diabetes mellitus with other specified complication: Secondary | ICD-10-CM | POA: Diagnosis not present

## 2019-02-21 DIAGNOSIS — M85851 Other specified disorders of bone density and structure, right thigh: Secondary | ICD-10-CM | POA: Diagnosis not present

## 2019-02-21 DIAGNOSIS — I4891 Unspecified atrial fibrillation: Secondary | ICD-10-CM | POA: Diagnosis not present

## 2019-02-23 DIAGNOSIS — R609 Edema, unspecified: Secondary | ICD-10-CM | POA: Diagnosis not present

## 2019-02-23 DIAGNOSIS — R946 Abnormal results of thyroid function studies: Secondary | ICD-10-CM | POA: Diagnosis not present

## 2019-02-23 DIAGNOSIS — E1169 Type 2 diabetes mellitus with other specified complication: Secondary | ICD-10-CM | POA: Diagnosis not present

## 2019-02-23 DIAGNOSIS — I4891 Unspecified atrial fibrillation: Secondary | ICD-10-CM | POA: Diagnosis not present

## 2019-02-23 DIAGNOSIS — Z6841 Body Mass Index (BMI) 40.0 and over, adult: Secondary | ICD-10-CM | POA: Diagnosis not present

## 2019-02-23 DIAGNOSIS — K219 Gastro-esophageal reflux disease without esophagitis: Secondary | ICD-10-CM | POA: Diagnosis not present

## 2019-02-23 DIAGNOSIS — Z0000 Encounter for general adult medical examination without abnormal findings: Secondary | ICD-10-CM | POA: Diagnosis not present

## 2019-02-23 DIAGNOSIS — I10 Essential (primary) hypertension: Secondary | ICD-10-CM | POA: Diagnosis not present

## 2019-02-23 DIAGNOSIS — D72819 Decreased white blood cell count, unspecified: Secondary | ICD-10-CM | POA: Diagnosis not present

## 2019-02-23 DIAGNOSIS — E782 Mixed hyperlipidemia: Secondary | ICD-10-CM | POA: Diagnosis not present

## 2019-02-23 DIAGNOSIS — E119 Type 2 diabetes mellitus without complications: Secondary | ICD-10-CM | POA: Diagnosis not present

## 2019-03-08 DIAGNOSIS — Z7901 Long term (current) use of anticoagulants: Secondary | ICD-10-CM | POA: Diagnosis not present

## 2019-03-08 DIAGNOSIS — I4891 Unspecified atrial fibrillation: Secondary | ICD-10-CM | POA: Diagnosis not present

## 2019-03-10 DIAGNOSIS — S52572D Other intraarticular fracture of lower end of left radius, subsequent encounter for closed fracture with routine healing: Secondary | ICD-10-CM | POA: Diagnosis not present

## 2019-03-10 DIAGNOSIS — S2249XA Multiple fractures of ribs, unspecified side, initial encounter for closed fracture: Secondary | ICD-10-CM | POA: Diagnosis not present

## 2019-03-10 DIAGNOSIS — S82001A Unspecified fracture of right patella, initial encounter for closed fracture: Secondary | ICD-10-CM | POA: Diagnosis not present

## 2019-03-10 DIAGNOSIS — S2243XD Multiple fractures of ribs, bilateral, subsequent encounter for fracture with routine healing: Secondary | ICD-10-CM | POA: Diagnosis not present
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
## 2019-03-21 DIAGNOSIS — N390 Urinary tract infection, site not specified: Secondary | ICD-10-CM | POA: Diagnosis not present
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
## 2019-04-05 DIAGNOSIS — I4891 Unspecified atrial fibrillation: Secondary | ICD-10-CM | POA: Diagnosis not present

## 2019-04-05 DIAGNOSIS — E039 Hypothyroidism, unspecified: Secondary | ICD-10-CM | POA: Diagnosis not present

## 2019-04-05 DIAGNOSIS — R7989 Other specified abnormal findings of blood chemistry: Secondary | ICD-10-CM | POA: Diagnosis not present

## 2019-04-05 DIAGNOSIS — R946 Abnormal results of thyroid function studies: Secondary | ICD-10-CM | POA: Diagnosis not present

## 2019-04-06 DIAGNOSIS — N390 Urinary tract infection, site not specified: Secondary | ICD-10-CM | POA: Diagnosis not present

## 2019-04-09 DIAGNOSIS — S2249XA Multiple fractures of ribs, unspecified side, initial encounter for closed fracture: Secondary | ICD-10-CM | POA: Diagnosis not present

## 2019-04-09 DIAGNOSIS — S52572D Other intraarticular fracture of lower end of left radius, subsequent encounter for closed fracture with routine healing: Secondary | ICD-10-CM | POA: Diagnosis not present

## 2019-04-09 DIAGNOSIS — S82001A Unspecified fracture of right patella, initial encounter for closed fracture: Secondary | ICD-10-CM | POA: Diagnosis not present

## 2019-04-09 DIAGNOSIS — S2243XD Multiple fractures of ribs, bilateral, subsequent encounter for fracture with routine healing: Secondary | ICD-10-CM | POA: Diagnosis not present

## 2019-04-17 ENCOUNTER — Other Ambulatory Visit: Payer: Self-pay | Admitting: Family Medicine

## 2019-04-17 DIAGNOSIS — M85851 Other specified disorders of bone density and structure, right thigh: Secondary | ICD-10-CM

## 2019-05-08 DIAGNOSIS — I4891 Unspecified atrial fibrillation: Secondary | ICD-10-CM | POA: Diagnosis not present

## 2019-05-08 DIAGNOSIS — Z7901 Long term (current) use of anticoagulants: Secondary | ICD-10-CM | POA: Diagnosis not present

## 2019-05-10 DIAGNOSIS — S2249XA Multiple fractures of ribs, unspecified side, initial encounter for closed fracture: Secondary | ICD-10-CM | POA: Diagnosis not present

## 2019-05-10 DIAGNOSIS — S2243XD Multiple fractures of ribs, bilateral, subsequent encounter for fracture with routine healing: Secondary | ICD-10-CM | POA: Diagnosis not present

## 2019-05-10 DIAGNOSIS — S52572D Other intraarticular fracture of lower end of left radius, subsequent encounter for closed fracture with routine healing: Secondary | ICD-10-CM | POA: Diagnosis not present

## 2019-05-10 DIAGNOSIS — S82001A Unspecified fracture of right patella, initial encounter for closed fracture: Secondary | ICD-10-CM | POA: Diagnosis not present

## 2019-05-15 DIAGNOSIS — I4891 Unspecified atrial fibrillation: Secondary | ICD-10-CM | POA: Diagnosis not present

## 2019-05-15 DIAGNOSIS — Z7901 Long term (current) use of anticoagulants: Secondary | ICD-10-CM | POA: Diagnosis not present

## 2019-06-01 DIAGNOSIS — M545 Low back pain: Secondary | ICD-10-CM | POA: Diagnosis not present

## 2019-06-10 DIAGNOSIS — S82001A Unspecified fracture of right patella, initial encounter for closed fracture: Secondary | ICD-10-CM | POA: Diagnosis not present

## 2019-06-10 DIAGNOSIS — S2243XD Multiple fractures of ribs, bilateral, subsequent encounter for fracture with routine healing: Secondary | ICD-10-CM | POA: Diagnosis not present

## 2019-06-10 DIAGNOSIS — S52572D Other intraarticular fracture of lower end of left radius, subsequent encounter for closed fracture with routine healing: Secondary | ICD-10-CM | POA: Diagnosis not present

## 2019-06-10 DIAGNOSIS — S2249XA Multiple fractures of ribs, unspecified side, initial encounter for closed fracture: Secondary | ICD-10-CM | POA: Diagnosis not present

## 2019-06-11 DIAGNOSIS — M545 Low back pain: Secondary | ICD-10-CM | POA: Diagnosis not present

## 2019-06-12 DIAGNOSIS — I4891 Unspecified atrial fibrillation: Secondary | ICD-10-CM | POA: Diagnosis not present

## 2019-06-12 DIAGNOSIS — Z7901 Long term (current) use of anticoagulants: Secondary | ICD-10-CM | POA: Diagnosis not present

## 2019-06-20 DIAGNOSIS — M5416 Radiculopathy, lumbar region: Secondary | ICD-10-CM | POA: Diagnosis not present

## 2019-06-20 DIAGNOSIS — M545 Low back pain: Secondary | ICD-10-CM | POA: Diagnosis not present

## 2019-06-26 DIAGNOSIS — Z7901 Long term (current) use of anticoagulants: Secondary | ICD-10-CM | POA: Diagnosis not present

## 2019-06-26 DIAGNOSIS — I4891 Unspecified atrial fibrillation: Secondary | ICD-10-CM | POA: Diagnosis not present

## 2019-07-24 DIAGNOSIS — Z7901 Long term (current) use of anticoagulants: Secondary | ICD-10-CM | POA: Diagnosis not present

## 2019-07-24 DIAGNOSIS — I4891 Unspecified atrial fibrillation: Secondary | ICD-10-CM | POA: Diagnosis not present

## 2019-07-31 DIAGNOSIS — I4891 Unspecified atrial fibrillation: Secondary | ICD-10-CM | POA: Diagnosis not present

## 2019-07-31 DIAGNOSIS — Z7901 Long term (current) use of anticoagulants: Secondary | ICD-10-CM | POA: Diagnosis not present

## 2019-08-07 ENCOUNTER — Other Ambulatory Visit: Payer: Self-pay | Admitting: Family Medicine

## 2019-08-07 DIAGNOSIS — Z1231 Encounter for screening mammogram for malignant neoplasm of breast: Secondary | ICD-10-CM

## 2019-08-21 ENCOUNTER — Ambulatory Visit
Admission: RE | Admit: 2019-08-21 | Discharge: 2019-08-21 | Disposition: A | Discharge status: Home / Self Care | Payer: Medicare HMO | Source: Ambulatory Visit | Attending: Family Medicine | Admitting: Family Medicine

## 2019-08-21 ENCOUNTER — Other Ambulatory Visit: Payer: Self-pay

## 2019-08-21 DIAGNOSIS — Z1231 Encounter for screening mammogram for malignant neoplasm of breast: Secondary | ICD-10-CM

## 2019-08-28 DIAGNOSIS — Z7901 Long term (current) use of anticoagulants: Secondary | ICD-10-CM | POA: Diagnosis not present

## 2019-08-28 DIAGNOSIS — I4891 Unspecified atrial fibrillation: Secondary | ICD-10-CM | POA: Diagnosis not present

## 2019-09-17 DIAGNOSIS — E119 Type 2 diabetes mellitus without complications: Secondary | ICD-10-CM | POA: Diagnosis not present

## 2019-09-17 DIAGNOSIS — R300 Dysuria: Secondary | ICD-10-CM | POA: Diagnosis not present

## 2019-09-17 DIAGNOSIS — Z7901 Long term (current) use of anticoagulants: Secondary | ICD-10-CM | POA: Diagnosis not present

## 2019-09-25 DIAGNOSIS — I4891 Unspecified atrial fibrillation: Secondary | ICD-10-CM | POA: Diagnosis not present

## 2019-09-25 DIAGNOSIS — Z7901 Long term (current) use of anticoagulants: Secondary | ICD-10-CM | POA: Diagnosis not present

## 2019-10-05 ENCOUNTER — Ambulatory Visit: Payer: Medicare HMO

## 2019-10-23 DIAGNOSIS — I4891 Unspecified atrial fibrillation: Secondary | ICD-10-CM | POA: Diagnosis not present

## 2019-10-23 DIAGNOSIS — Z7901 Long term (current) use of anticoagulants: Secondary | ICD-10-CM | POA: Diagnosis not present

## 2019-11-02 DIAGNOSIS — R300 Dysuria: Secondary | ICD-10-CM | POA: Diagnosis not present

## 2019-11-02 DIAGNOSIS — N3001 Acute cystitis with hematuria: Secondary | ICD-10-CM | POA: Diagnosis not present

## 2019-11-20 DIAGNOSIS — I4891 Unspecified atrial fibrillation: Secondary | ICD-10-CM | POA: Diagnosis not present

## 2019-11-20 DIAGNOSIS — Z7901 Long term (current) use of anticoagulants: Secondary | ICD-10-CM | POA: Diagnosis not present

## 2019-12-18 DIAGNOSIS — I4891 Unspecified atrial fibrillation: Secondary | ICD-10-CM | POA: Diagnosis not present

## 2019-12-18 DIAGNOSIS — Z7901 Long term (current) use of anticoagulants: Secondary | ICD-10-CM | POA: Diagnosis not present

## 2019-12-31 DIAGNOSIS — N390 Urinary tract infection, site not specified: Secondary | ICD-10-CM | POA: Diagnosis not present

## 2019-12-31 DIAGNOSIS — R350 Frequency of micturition: Secondary | ICD-10-CM | POA: Diagnosis not present

## 2020-01-15 DIAGNOSIS — Z7901 Long term (current) use of anticoagulants: Secondary | ICD-10-CM | POA: Diagnosis not present

## 2020-01-15 DIAGNOSIS — I4891 Unspecified atrial fibrillation: Secondary | ICD-10-CM | POA: Diagnosis not present

## 2020-01-29 DIAGNOSIS — Z7901 Long term (current) use of anticoagulants: Secondary | ICD-10-CM | POA: Diagnosis not present

## 2020-01-29 DIAGNOSIS — I4891 Unspecified atrial fibrillation: Secondary | ICD-10-CM | POA: Diagnosis not present

## 2020-01-31 DIAGNOSIS — H52223 Regular astigmatism, bilateral: Secondary | ICD-10-CM | POA: Diagnosis not present

## 2020-02-12 DIAGNOSIS — I4891 Unspecified atrial fibrillation: Secondary | ICD-10-CM | POA: Diagnosis not present

## 2020-02-12 DIAGNOSIS — Z7901 Long term (current) use of anticoagulants: Secondary | ICD-10-CM | POA: Diagnosis not present

## 2020-02-26 DIAGNOSIS — I4891 Unspecified atrial fibrillation: Secondary | ICD-10-CM | POA: Diagnosis not present

## 2020-02-26 DIAGNOSIS — Z7901 Long term (current) use of anticoagulants: Secondary | ICD-10-CM | POA: Diagnosis not present

## 2020-03-01 DIAGNOSIS — N3000 Acute cystitis without hematuria: Secondary | ICD-10-CM | POA: Diagnosis not present

## 2020-03-01 DIAGNOSIS — R300 Dysuria: Secondary | ICD-10-CM | POA: Diagnosis not present

## 2020-03-14 DIAGNOSIS — E039 Hypothyroidism, unspecified: Secondary | ICD-10-CM | POA: Diagnosis not present

## 2020-03-14 DIAGNOSIS — I4891 Unspecified atrial fibrillation: Secondary | ICD-10-CM | POA: Diagnosis not present

## 2020-03-14 DIAGNOSIS — R609 Edema, unspecified: Secondary | ICD-10-CM | POA: Diagnosis not present

## 2020-03-14 DIAGNOSIS — E1169 Type 2 diabetes mellitus with other specified complication: Secondary | ICD-10-CM | POA: Diagnosis not present

## 2020-03-14 DIAGNOSIS — E782 Mixed hyperlipidemia: Secondary | ICD-10-CM | POA: Diagnosis not present

## 2020-03-14 DIAGNOSIS — Z1389 Encounter for screening for other disorder: Secondary | ICD-10-CM | POA: Diagnosis not present

## 2020-03-14 DIAGNOSIS — M85851 Other specified disorders of bone density and structure, right thigh: Secondary | ICD-10-CM | POA: Diagnosis not present

## 2020-03-14 DIAGNOSIS — I10 Essential (primary) hypertension: Secondary | ICD-10-CM | POA: Diagnosis not present

## 2020-03-14 DIAGNOSIS — Z0000 Encounter for general adult medical examination without abnormal findings: Secondary | ICD-10-CM | POA: Diagnosis not present

## 2020-03-14 DIAGNOSIS — K219 Gastro-esophageal reflux disease without esophagitis: Secondary | ICD-10-CM | POA: Diagnosis not present

## 2020-03-14 DIAGNOSIS — J309 Allergic rhinitis, unspecified: Secondary | ICD-10-CM | POA: Diagnosis not present

## 2020-03-17 ENCOUNTER — Other Ambulatory Visit: Payer: Self-pay | Admitting: Family Medicine

## 2020-03-17 DIAGNOSIS — M85851 Other specified disorders of bone density and structure, right thigh: Secondary | ICD-10-CM

## 2020-03-25 DIAGNOSIS — Z7901 Long term (current) use of anticoagulants: Secondary | ICD-10-CM | POA: Diagnosis not present

## 2020-03-25 DIAGNOSIS — I4891 Unspecified atrial fibrillation: Secondary | ICD-10-CM | POA: Diagnosis not present

## 2020-04-22 DIAGNOSIS — Z7901 Long term (current) use of anticoagulants: Secondary | ICD-10-CM | POA: Diagnosis not present

## 2020-04-22 DIAGNOSIS — I4891 Unspecified atrial fibrillation: Secondary | ICD-10-CM | POA: Diagnosis not present

## 2020-05-02 DIAGNOSIS — N390 Urinary tract infection, site not specified: Secondary | ICD-10-CM | POA: Diagnosis not present

## 2020-05-20 DIAGNOSIS — Z7901 Long term (current) use of anticoagulants: Secondary | ICD-10-CM | POA: Diagnosis not present

## 2020-05-20 DIAGNOSIS — I4891 Unspecified atrial fibrillation: Secondary | ICD-10-CM | POA: Diagnosis not present

## 2020-06-03 DIAGNOSIS — Z7901 Long term (current) use of anticoagulants: Secondary | ICD-10-CM | POA: Diagnosis not present

## 2020-06-03 DIAGNOSIS — I4891 Unspecified atrial fibrillation: Secondary | ICD-10-CM | POA: Diagnosis not present

## 2020-06-06 DIAGNOSIS — N390 Urinary tract infection, site not specified: Secondary | ICD-10-CM | POA: Diagnosis not present

## 2020-07-01 DIAGNOSIS — I4891 Unspecified atrial fibrillation: Secondary | ICD-10-CM | POA: Diagnosis not present

## 2020-07-01 DIAGNOSIS — Z7901 Long term (current) use of anticoagulants: Secondary | ICD-10-CM | POA: Diagnosis not present

## 2020-07-17 DIAGNOSIS — N390 Urinary tract infection, site not specified: Secondary | ICD-10-CM | POA: Diagnosis not present

## 2020-07-29 DIAGNOSIS — I4891 Unspecified atrial fibrillation: Secondary | ICD-10-CM | POA: Diagnosis not present

## 2020-07-29 DIAGNOSIS — Z7901 Long term (current) use of anticoagulants: Secondary | ICD-10-CM | POA: Diagnosis not present

## 2020-08-26 DIAGNOSIS — Z7901 Long term (current) use of anticoagulants: Secondary | ICD-10-CM | POA: Diagnosis not present

## 2020-08-26 DIAGNOSIS — I4891 Unspecified atrial fibrillation: Secondary | ICD-10-CM | POA: Diagnosis not present

## 2020-09-23 DIAGNOSIS — I4891 Unspecified atrial fibrillation: Secondary | ICD-10-CM | POA: Diagnosis not present

## 2020-09-23 DIAGNOSIS — Z7901 Long term (current) use of anticoagulants: Secondary | ICD-10-CM | POA: Diagnosis not present

## 2020-10-07 DIAGNOSIS — Z7901 Long term (current) use of anticoagulants: Secondary | ICD-10-CM | POA: Diagnosis not present

## 2020-10-07 DIAGNOSIS — I4891 Unspecified atrial fibrillation: Secondary | ICD-10-CM | POA: Diagnosis not present

## 2020-10-09 DIAGNOSIS — R350 Frequency of micturition: Secondary | ICD-10-CM | POA: Diagnosis not present

## 2020-10-09 DIAGNOSIS — N3021 Other chronic cystitis with hematuria: Secondary | ICD-10-CM | POA: Diagnosis not present

## 2020-10-09 DIAGNOSIS — R3915 Urgency of urination: Secondary | ICD-10-CM | POA: Diagnosis not present

## 2020-11-04 DIAGNOSIS — Z7901 Long term (current) use of anticoagulants: Secondary | ICD-10-CM | POA: Diagnosis not present

## 2020-11-08 DIAGNOSIS — N390 Urinary tract infection, site not specified: Secondary | ICD-10-CM | POA: Diagnosis not present

## 2020-12-04 DIAGNOSIS — R350 Frequency of micturition: Secondary | ICD-10-CM | POA: Diagnosis not present

## 2020-12-04 DIAGNOSIS — R3915 Urgency of urination: Secondary | ICD-10-CM | POA: Diagnosis not present

## 2020-12-04 DIAGNOSIS — N3021 Other chronic cystitis with hematuria: Secondary | ICD-10-CM | POA: Diagnosis not present

## 2020-12-09 DIAGNOSIS — I4891 Unspecified atrial fibrillation: Secondary | ICD-10-CM | POA: Diagnosis not present

## 2020-12-09 DIAGNOSIS — Z7901 Long term (current) use of anticoagulants: Secondary | ICD-10-CM | POA: Diagnosis not present

## 2021-01-06 DIAGNOSIS — Z7901 Long term (current) use of anticoagulants: Secondary | ICD-10-CM | POA: Diagnosis not present

## 2021-01-06 DIAGNOSIS — I4891 Unspecified atrial fibrillation: Secondary | ICD-10-CM | POA: Diagnosis not present

## 2021-02-03 DIAGNOSIS — Z7901 Long term (current) use of anticoagulants: Secondary | ICD-10-CM | POA: Diagnosis not present

## 2021-02-03 DIAGNOSIS — I4891 Unspecified atrial fibrillation: Secondary | ICD-10-CM | POA: Diagnosis not present

## 2021-03-03 DIAGNOSIS — I4891 Unspecified atrial fibrillation: Secondary | ICD-10-CM | POA: Diagnosis not present

## 2021-03-03 DIAGNOSIS — Z7901 Long term (current) use of anticoagulants: Secondary | ICD-10-CM | POA: Diagnosis not present

## 2021-03-05 DIAGNOSIS — R350 Frequency of micturition: Secondary | ICD-10-CM | POA: Diagnosis not present

## 2021-03-05 DIAGNOSIS — R3915 Urgency of urination: Secondary | ICD-10-CM | POA: Diagnosis not present

## 2021-03-05 DIAGNOSIS — N3021 Other chronic cystitis with hematuria: Secondary | ICD-10-CM | POA: Diagnosis not present

## 2021-03-17 DIAGNOSIS — E1169 Type 2 diabetes mellitus with other specified complication: Secondary | ICD-10-CM | POA: Diagnosis not present

## 2021-03-17 DIAGNOSIS — I10 Essential (primary) hypertension: Secondary | ICD-10-CM | POA: Diagnosis not present

## 2021-03-17 DIAGNOSIS — D72819 Decreased white blood cell count, unspecified: Secondary | ICD-10-CM | POA: Diagnosis not present

## 2021-03-17 DIAGNOSIS — E039 Hypothyroidism, unspecified: Secondary | ICD-10-CM | POA: Diagnosis not present

## 2021-03-17 DIAGNOSIS — I4891 Unspecified atrial fibrillation: Secondary | ICD-10-CM | POA: Diagnosis not present

## 2021-03-17 DIAGNOSIS — M85851 Other specified disorders of bone density and structure, right thigh: Secondary | ICD-10-CM | POA: Diagnosis not present

## 2021-03-17 DIAGNOSIS — Z0000 Encounter for general adult medical examination without abnormal findings: Secondary | ICD-10-CM | POA: Diagnosis not present

## 2021-03-17 DIAGNOSIS — H1013 Acute atopic conjunctivitis, bilateral: Secondary | ICD-10-CM | POA: Diagnosis not present

## 2021-03-17 DIAGNOSIS — E782 Mixed hyperlipidemia: Secondary | ICD-10-CM | POA: Diagnosis not present

## 2021-03-17 DIAGNOSIS — Z1389 Encounter for screening for other disorder: Secondary | ICD-10-CM | POA: Diagnosis not present

## 2021-03-20 ENCOUNTER — Other Ambulatory Visit: Payer: Self-pay | Admitting: Family Medicine

## 2021-03-20 DIAGNOSIS — M8580 Other specified disorders of bone density and structure, unspecified site: Secondary | ICD-10-CM

## 2021-03-27 DIAGNOSIS — K047 Periapical abscess without sinus: Secondary | ICD-10-CM | POA: Diagnosis not present

## 2021-03-31 DIAGNOSIS — Z7901 Long term (current) use of anticoagulants: Secondary | ICD-10-CM | POA: Diagnosis not present

## 2021-04-28 ENCOUNTER — Ambulatory Visit
Admission: RE | Admit: 2021-04-28 | Discharge: 2021-04-28 | Disposition: A | Discharge status: Home / Self Care | Payer: Medicare HMO | Source: Ambulatory Visit | Attending: Family Medicine | Admitting: Family Medicine

## 2021-04-28 DIAGNOSIS — Z7901 Long term (current) use of anticoagulants: Secondary | ICD-10-CM | POA: Diagnosis not present

## 2021-04-28 DIAGNOSIS — I4891 Unspecified atrial fibrillation: Secondary | ICD-10-CM | POA: Diagnosis not present

## 2021-04-28 DIAGNOSIS — M85852 Other specified disorders of bone density and structure, left thigh: Secondary | ICD-10-CM | POA: Diagnosis not present

## 2021-04-28 DIAGNOSIS — M8580 Other specified disorders of bone density and structure, unspecified site: Secondary | ICD-10-CM

## 2021-04-28 DIAGNOSIS — Z780 Asymptomatic menopausal state: Secondary | ICD-10-CM | POA: Diagnosis not present

## 2021-04-29 ENCOUNTER — Other Ambulatory Visit: Payer: Self-pay

## 2021-05-23 DIAGNOSIS — R52 Pain, unspecified: Secondary | ICD-10-CM | POA: Diagnosis not present

## 2021-05-23 DIAGNOSIS — Z03818 Encounter for observation for suspected exposure to other biological agents ruled out: Secondary | ICD-10-CM | POA: Diagnosis not present

## 2021-05-23 DIAGNOSIS — R6883 Chills (without fever): Secondary | ICD-10-CM | POA: Diagnosis not present

## 2021-05-23 DIAGNOSIS — R0981 Nasal congestion: Secondary | ICD-10-CM | POA: Diagnosis not present

## 2021-05-26 DIAGNOSIS — Z7901 Long term (current) use of anticoagulants: Secondary | ICD-10-CM | POA: Diagnosis not present

## 2021-06-09 DIAGNOSIS — Z7901 Long term (current) use of anticoagulants: Secondary | ICD-10-CM | POA: Diagnosis not present

## 2021-07-07 DIAGNOSIS — Z7901 Long term (current) use of anticoagulants: Secondary | ICD-10-CM | POA: Diagnosis not present

## 2021-08-04 DIAGNOSIS — Z7901 Long term (current) use of anticoagulants: Secondary | ICD-10-CM | POA: Diagnosis not present

## 2021-09-01 DIAGNOSIS — I4891 Unspecified atrial fibrillation: Secondary | ICD-10-CM | POA: Diagnosis not present

## 2021-09-01 DIAGNOSIS — Z7901 Long term (current) use of anticoagulants: Secondary | ICD-10-CM | POA: Diagnosis not present

## 2021-09-15 DIAGNOSIS — N390 Urinary tract infection, site not specified: Secondary | ICD-10-CM | POA: Diagnosis not present

## 2021-09-29 DIAGNOSIS — Z7901 Long term (current) use of anticoagulants: Secondary | ICD-10-CM | POA: Diagnosis not present

## 2021-09-29 DIAGNOSIS — I4891 Unspecified atrial fibrillation: Secondary | ICD-10-CM | POA: Diagnosis not present

## 2021-10-09 DIAGNOSIS — R3915 Urgency of urination: Secondary | ICD-10-CM | POA: Diagnosis not present

## 2021-10-09 DIAGNOSIS — R350 Frequency of micturition: Secondary | ICD-10-CM | POA: Diagnosis not present

## 2021-10-09 DIAGNOSIS — N3021 Other chronic cystitis with hematuria: Secondary | ICD-10-CM | POA: Diagnosis not present

## 2021-10-30 DIAGNOSIS — J069 Acute upper respiratory infection, unspecified: Secondary | ICD-10-CM | POA: Diagnosis not present

## 2021-10-30 DIAGNOSIS — Z03818 Encounter for observation for suspected exposure to other biological agents ruled out: Secondary | ICD-10-CM | POA: Diagnosis not present

## 2021-11-03 DIAGNOSIS — Z7901 Long term (current) use of anticoagulants: Secondary | ICD-10-CM | POA: Diagnosis not present

## 2021-12-01 DIAGNOSIS — Z7901 Long term (current) use of anticoagulants: Secondary | ICD-10-CM | POA: Diagnosis not present

## 2021-12-29 DIAGNOSIS — Z7901 Long term (current) use of anticoagulants: Secondary | ICD-10-CM | POA: Diagnosis not present

## 2021-12-29 DIAGNOSIS — I4891 Unspecified atrial fibrillation: Secondary | ICD-10-CM | POA: Diagnosis not present

## 2022-01-06 DIAGNOSIS — Z7901 Long term (current) use of anticoagulants: Secondary | ICD-10-CM | POA: Diagnosis not present

## 2022-01-06 DIAGNOSIS — I4891 Unspecified atrial fibrillation: Secondary | ICD-10-CM | POA: Diagnosis not present

## 2022-02-02 DIAGNOSIS — Z7901 Long term (current) use of anticoagulants: Secondary | ICD-10-CM | POA: Diagnosis not present

## 2022-02-02 DIAGNOSIS — I4891 Unspecified atrial fibrillation: Secondary | ICD-10-CM | POA: Diagnosis not present

## 2022-02-10 DIAGNOSIS — R300 Dysuria: Secondary | ICD-10-CM | POA: Diagnosis not present

## 2022-02-10 DIAGNOSIS — N390 Urinary tract infection, site not specified: Secondary | ICD-10-CM | POA: Diagnosis not present

## 2022-03-02 DIAGNOSIS — Z7901 Long term (current) use of anticoagulants: Secondary | ICD-10-CM | POA: Diagnosis not present

## 2022-03-31 DIAGNOSIS — Z7901 Long term (current) use of anticoagulants: Secondary | ICD-10-CM | POA: Diagnosis not present

## 2022-03-31 DIAGNOSIS — I4891 Unspecified atrial fibrillation: Secondary | ICD-10-CM | POA: Diagnosis not present

## 2022-04-23 DIAGNOSIS — M85852 Other specified disorders of bone density and structure, left thigh: Secondary | ICD-10-CM | POA: Diagnosis not present

## 2022-04-23 DIAGNOSIS — E039 Hypothyroidism, unspecified: Secondary | ICD-10-CM | POA: Diagnosis not present

## 2022-04-23 DIAGNOSIS — D72819 Decreased white blood cell count, unspecified: Secondary | ICD-10-CM | POA: Diagnosis not present

## 2022-04-23 DIAGNOSIS — I10 Essential (primary) hypertension: Secondary | ICD-10-CM | POA: Diagnosis not present

## 2022-04-23 DIAGNOSIS — I4891 Unspecified atrial fibrillation: Secondary | ICD-10-CM | POA: Diagnosis not present

## 2022-04-23 DIAGNOSIS — E782 Mixed hyperlipidemia: Secondary | ICD-10-CM | POA: Diagnosis not present

## 2022-04-23 DIAGNOSIS — Z1331 Encounter for screening for depression: Secondary | ICD-10-CM | POA: Diagnosis not present

## 2022-04-23 DIAGNOSIS — Z0000 Encounter for general adult medical examination without abnormal findings: Secondary | ICD-10-CM | POA: Diagnosis not present

## 2022-04-23 DIAGNOSIS — E1169 Type 2 diabetes mellitus with other specified complication: Secondary | ICD-10-CM | POA: Diagnosis not present

## 2022-05-12 ENCOUNTER — Other Ambulatory Visit: Payer: Self-pay

## 2022-05-12 NOTE — Patient Outreach (Signed)
Aging Gracefully Program  05/12/2022  Nancy Thomas 05/11/1951 [id number]   [facility] Evaluation Interviewer made contact with patient. Aging Gracefully survey completed.   Interviewer will send referral to RN and OT for follow up.   [place] Management Assistant [phone]

## 2022-05-18 DIAGNOSIS — Z7901 Long term (current) use of anticoagulants: Secondary | ICD-10-CM | POA: Diagnosis not present

## 2022-05-18 DIAGNOSIS — I4891 Unspecified atrial fibrillation: Secondary | ICD-10-CM | POA: Diagnosis not present

## 2022-06-01 ENCOUNTER — Other Ambulatory Visit: Payer: Self-pay | Admitting: Occupational Therapy

## 2022-06-04 DIAGNOSIS — M545 Low back pain, unspecified: Secondary | ICD-10-CM | POA: Diagnosis not present

## 2022-06-06 NOTE — Patient Instructions (Signed)
Goals Addressed             This Visit's Progress    Patient Stated       She would like to feel more safe and independent in her bathroom and the guest bathroom (lower threshold in walk in shower with shower dam, built in seat removed, additional grab bar put on wall outside shower on the right as you enter shower, higher toilets in both bathrooms.)     Patient Stated       Feel safer getting in back door (grab bar on left hand side of back as you enter).     Patient Stated       Ease of picking up objects off the floor (reacher)     Patient Stated       Ease of getting socks on and off (reacher and wide sock aide)
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
## 2022-06-15 ENCOUNTER — Telehealth: Payer: Self-pay

## 2022-06-15 DIAGNOSIS — Z7901 Long term (current) use of anticoagulants: Secondary | ICD-10-CM | POA: Diagnosis not present

## 2022-06-15 DIAGNOSIS — I4891 Unspecified atrial fibrillation: Secondary | ICD-10-CM | POA: Diagnosis not present

## 2022-06-15 NOTE — Patient Outreach (Signed)
Aging Gracefully Program  06/15/2022  Nancy Thomas 1951-04-29 [id number]   Incoming call from patient about scheduling RN home visit.  Appointment made for 06/22/2022 at 1130 am.  Reviewed and confirmed address  [name] RN, BSN, CEN RN Case Freight forwarder for [employer] Mobile: [phone]

## 2022-06-15 NOTE — Patient Outreach (Signed)
Aging Gracefully Program  06/15/2022  Kalissa Grays [birth year] [id number]   Placed call to patient to schedule RN home visit. Left a message requesting a call back.  [name] RN, BSN, Careers information officer for [employer] Mobile: [phone]

## 2022-06-17 DIAGNOSIS — M4807 Spinal stenosis, lumbosacral region: Secondary | ICD-10-CM | POA: Diagnosis not present

## 2022-06-17 DIAGNOSIS — M47816 Spondylosis without myelopathy or radiculopathy, lumbar region: Secondary | ICD-10-CM | POA: Diagnosis not present

## 2022-06-17 DIAGNOSIS — M5126 Other intervertebral disc displacement, lumbar region: Secondary | ICD-10-CM | POA: Diagnosis not present

## 2022-06-17 DIAGNOSIS — M47817 Spondylosis without myelopathy or radiculopathy, lumbosacral region: Secondary | ICD-10-CM | POA: Diagnosis not present

## 2022-06-17 DIAGNOSIS — M48061 Spinal stenosis, lumbar region without neurogenic claudication: Secondary | ICD-10-CM | POA: Diagnosis not present

## 2022-06-22 ENCOUNTER — Other Ambulatory Visit: Payer: Self-pay

## 2022-06-22 NOTE — Patient Outreach (Signed)
Aging Gracefully Program  RN Visit  06/22/2022  Nancy Thomas June 10, 1951 [id number]  Visit:   RN home visit #1  Start Time:   1130 End Time:   105 Total Minutes:   95  Readiness To Change Score:     Universal RN Interventions: Calendar Distribution: Yes Exercise Review: No Medication Changes: No Mood: Yes Pain: Yes PCP Advocacy/Support: No Fall Prevention: Yes Incontinence: No Clinician View Of Client Situation: Patient ambulatory to the door without any assistive devices. Awake and alert.  Home cluttered. Holes in cabinets, and doorways. Patient sleeps in her lift chair. Client View Of His/Her Situation: Patient reports that she wants to get her home fixed.  Report she lived withher brother who was in an electric wheelchair and his chair messed up the home.  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  Patients biggest concern is her back pain/mobility and her sleeping patterns.  Patient reports she is lonely and wants to socialize.  Healthcare Provider Communication: Did Higher education careers adviser With [employer] Provider?: No According to Client, Did PCP Report Communication With An Aging Gracefully RN?: No  Clinician View of Client Situation: Clinician View Of Client Situation: Patient ambulatory to the door without any assistive devices. Awake and alert.  Home cluttered. Holes in cabinets, and doorways. Patient sleeps in her lift chair. Client's View of His/Her Situation: Client View Of His/Her Situation: Patient reports that she wants to get her home fixed.  Report she lived withher brother who was in an electric wheelchair and his chair messed up the home.  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  Patients biggest concern is her back pain/mobility and her sleeping patterns.  Patient reports she is lonely and wants to socialize.  Medication Assessment: Do You Have Any Problems Paying For Medications?: No Where Does Client Store Medications?: Kitchen Table  (prescription meds in bedroom, supplements in the kitchen) Can Client Read Pill Bottles?: Yes Does Client Use A Pillbox?: Yes Does Anyone Assist Client In Filling Pillbox?: No Does Anyone Assist Client In Taking Medications?: No Do You Take Vitamin D?: Yes Does Client Have Any Questions Or Concerns About Medictions?: No Is Client Complaining Of Any Symptoms That Could Be Side Effects To Medications?: No Any Possible Changes In Medication Regimen?: No   Outpatient Encounter Medications as of 06/22/2022  Medication Sig   acetaminophen (TYLENOL) 500 MG tablet Take 1 tablet (500 mg total) by mouth every 8 (eight) hours as needed for moderate pain.   Cholecalciferol (VITAMIN D PO) Take 1 tablet by mouth daily.   cyclobenzaprine (FLEXERIL) 5 MG tablet Take 1 tablet (5 mg total) by mouth 3 (three) times daily as needed for muscle spasms.   D-Mannose 500 MG CAPS Take 1,000 mg by mouth daily.   MAGNESIUM CITRATE PO Take 250 mg by mouth daily.   pravastatin (PRAVACHOL) 40 MG tablet Take 40 mg by mouth at bedtime.    warfarin (COUMADIN) 4 MG tablet Take 4 mg by mouth 2 (two) times a week. Tuesday and Thursday   warfarin (COUMADIN) 5 MG tablet Take 5 mg by mouth See admin instructions. Takes on Sat, Sun, Mon, Wed, Friday   CALCIUM PO Take 1 tablet by mouth daily. (Patient not taking: Reported on 06/22/2022)   CARTIA XT 180 MG 24 hr capsule TAKE 1 CAPSULE DAILY. (Patient not taking: No sig reported)   docusate sodium (COLACE) 100 MG capsule Take 1 capsule (100 mg total) by mouth daily. (Patient not taking: Reported  on 06/22/2022)   famotidine (PEPCID) 20 MG tablet Take 1 tablet (20 mg total) by mouth daily. (Patient not taking: Reported on 06/22/2022)   traMADol (ULTRAM) 50 MG tablet Take 2 tablets (100 mg total) by mouth 3 (three) times daily as needed. (Patient not taking: Reported on 06/22/2022)   No facility-administered encounter medications on file as of 06/22/2022.    OT Update: pending community  housing solutions assessment  Session Summary: doing well. Goals set. Reviewed intervention with patient.  Follow up to be scheduled to get results of MRI.   Goals Addressed               This Visit's Progress     Aging Gracefully RN (pt-stated)        Goals: Patient will report decrease in pain in back and knees in the next 120 days.  06/22/2022 Assessment: pending MRI results of back. C/O pain in the back in knees.  Reports difficulty walking at times.Uses a wheelchair to walk behind. Reports 1 fall about 2-3 weeks ago. Family assisted client with  getting up out of the floor.  Interventions: reviewed fall prevention. Reviewed importance of follow up with MD.  Plan: follow up in 1 month.  [name] RN, BSN, Careers information officer for [facility] Mobile: [phone]         Aging Gracefully RN (pt-stated)        Goal:  Patient will report improved sleep schedule in the next 120 days.  06/22/2022 Assessment: Reviewed patients concern for her sleeping schedule. Patient sleeps during the day and is up at night.   Interventions: reviewed with patient her current sleep schedule fosters loneliness. Reviewed importance of being awake during daytime hours to be able to go to the senior center and be social.  Encouraged patient to back up hr bedtime by 30 minutes per week to get on a better schedule. Reviewed being active will also help her be more sleepy.  Review OTC melatonin. Encouraged patient to do pool exercises to lessen the pounding on her joints.  PLAN:Next home visit planned for  07/13/2022  [name] RN, BSN, CEN RN Case Freight forwarder for [facility] Mobile: [phone]        [name] RN, BSN, Careers information officer for [employer] Mobile: [phone]

## 2022-06-22 NOTE — Patient Instructions (Signed)
Visit Information  Thank you for taking time to visit with me today. Please don't hesitate to contact me if I can be of assistance to you before our next scheduled telephone appointment.  Following are the goals we discussed today:   Goals Addressed               This Visit's Progress     Aging Gracefully RN (pt-stated)        Goals: Patient will report decrease in pain in back and knees in the next 120 days.  06/22/2022 Assessment: pending MRI results of back. C/O pain in the back in knees.  Reports difficulty walking at times.Uses a wheelchair to walk behind. Reports 1 fall about 2-3 weeks ago. Family assisted client with  getting up out of the floor.  Interventions: reviewed fall prevention. Reviewed importance of follow up with MD.  Plan: follow up in 1 month.  [name] RN, BSN, Careers information officer for [facility] Mobile: [phone]         Aging Gracefully RN (pt-stated)        Goal:  Patient will report improved sleep schedule in the next 120 days.  06/22/2022 Assessment: Reviewed patients concern for her sleeping schedule. Patient sleeps during the day and is up at night.   Interventions: reviewed with patient her current sleep schedule fosters loneliness. Reviewed importance of being awake during daytime hours to be able to go to the senior center and be social.  Encouraged patient to back up hr bedtime by 30 minutes per week to get on a better schedule. Reviewed being active will also help her be more sleepy.  Review OTC melatonin. Encouraged patient to do pool exercises to lessen the pounding on her joints.  PLAN:Next home visit planned for  07/13/2022  [name] RN, BSN, CEN RN Case Manager for [employer] Mobile: [phone]          Our next appointment is  in person at patients home  on 07/13/2022 at 1030 am  Please call the care guide team at [phone] if you need to cancel or  reschedule your appointment.   If you are experiencing a Mental Health or [place] or need someone to talk to, please call the Suicide and Crisis Lifeline: 988 call the [place] National Suicide Prevention Lifeline: 602-597-3814 or TTY: 727-633-3147 TTY 949 446 3705) to talk to a trained counselor call 1-800-273-TALK (toll free, 24 hour hotline) go to [facility] [address], [place] [id number]) call 911   The patient verbalized understanding of instructions, educational materials, and care plan provided today and agreed to receive a mailed copy of patient instructions, educational materials, and care plan.   [name] RN, BSN, Careers information officer for [employer] Mobile: [phone]

## 2022-06-25 DIAGNOSIS — Z7901 Long term (current) use of anticoagulants: Secondary | ICD-10-CM | POA: Diagnosis not present

## 2022-06-25 DIAGNOSIS — M545 Low back pain, unspecified: Secondary | ICD-10-CM | POA: Diagnosis not present

## 2022-06-25 DIAGNOSIS — N3001 Acute cystitis with hematuria: Secondary | ICD-10-CM | POA: Diagnosis not present

## 2022-06-25 DIAGNOSIS — Z6841 Body Mass Index (BMI) 40.0 and over, adult: Secondary | ICD-10-CM | POA: Diagnosis not present

## 2022-06-25 DIAGNOSIS — M533 Sacrococcygeal disorders, not elsewhere classified: Secondary | ICD-10-CM | POA: Diagnosis not present

## 2022-06-25 DIAGNOSIS — R350 Frequency of micturition: Secondary | ICD-10-CM | POA: Diagnosis not present

## 2022-07-06 ENCOUNTER — Other Ambulatory Visit: Payer: Self-pay

## 2022-07-06 ENCOUNTER — Encounter: Payer: Self-pay | Admitting: Rehabilitative and Restorative Service Providers"

## 2022-07-06 ENCOUNTER — Ambulatory Visit: Payer: Medicare HMO | Attending: Anesthesiology | Admitting: Rehabilitative and Restorative Service Providers"

## 2022-07-06 DIAGNOSIS — M6281 Muscle weakness (generalized): Secondary | ICD-10-CM | POA: Diagnosis not present

## 2022-07-06 DIAGNOSIS — R252 Cramp and spasm: Secondary | ICD-10-CM

## 2022-07-06 DIAGNOSIS — M5459 Other low back pain: Secondary | ICD-10-CM | POA: Diagnosis not present

## 2022-07-06 DIAGNOSIS — R2689 Other abnormalities of gait and mobility: Secondary | ICD-10-CM | POA: Diagnosis not present

## 2022-07-06 DIAGNOSIS — M545 Low back pain, unspecified: Secondary | ICD-10-CM | POA: Diagnosis not present

## 2022-07-06 NOTE — Patient Instructions (Signed)
[facility] Welcome to [facility]! Here you will find all the information you will need regarding your pool therapy. If you have further questions at any time, please call our office at [phone]. After completing your initial evaluation in the [facility] clinic, you may be eligible to complete a portion of your therapy in the pool. A typical week of therapy will consist of 1-2 typical physical therapy visits at our [place] location and an additional session of therapy in the pool located at the [facility]. [address], [place]. The phone number at the pool site is [phone]. Please call this number if you are running late or need to cancel your appointment.  Aquatic therapy will be offered on Wednesday mornings and Friday afternoons. Each session will last approximately 45 minutes. All scheduling and payments for aquatic therapy sessions, including cancelations, will be done through our [place] location.  To be eligible for aquatic therapy, these criteria must be met: You must be able to independently change in the locker room and get to the pool deck. A caregiver can come with you to help if needed. There are benches for a caregiver to sit on next to the pool. No one with an open wound is permitted in the pool.  Handicap parking is available in the front and there is a drop off option for even closer accessibility. Please arrive 15 minutes prior to your appointment to prepare for your pool session. You must sign in at the front desk upon your arrival. Please be sure to attend to any toileting needs prior to entering the pool. [place] rooms for changing are available.  There is direct access to the pool deck from the locker room. You can lock your belongings in a locker or bring them with you poolside. Your therapist will greet you on the pool deck. There may be other swimmers in the pool at the  same time but your session is one-on-one with the therapist.
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
## 2022-07-07 ENCOUNTER — Encounter: Payer: Self-pay | Admitting: Physical Therapy

## 2022-07-07 ENCOUNTER — Ambulatory Visit: Payer: Medicare HMO | Admitting: Physical Therapy

## 2022-07-07 DIAGNOSIS — R2689 Other abnormalities of gait and mobility: Secondary | ICD-10-CM | POA: Diagnosis not present

## 2022-07-07 DIAGNOSIS — M6281 Muscle weakness (generalized): Secondary | ICD-10-CM | POA: Diagnosis not present

## 2022-07-07 DIAGNOSIS — M545 Low back pain, unspecified: Secondary | ICD-10-CM | POA: Diagnosis not present

## 2022-07-07 DIAGNOSIS — R252 Cramp and spasm: Secondary | ICD-10-CM

## 2022-07-07 DIAGNOSIS — M5459 Other low back pain: Secondary | ICD-10-CM | POA: Diagnosis not present
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
## 2022-07-13 ENCOUNTER — Other Ambulatory Visit: Payer: Self-pay

## 2022-07-13 DIAGNOSIS — Z7901 Long term (current) use of anticoagulants: Secondary | ICD-10-CM | POA: Diagnosis not present

## 2022-07-13 DIAGNOSIS — I4891 Unspecified atrial fibrillation: Secondary | ICD-10-CM | POA: Diagnosis not present

## 2022-07-13 NOTE — Patient Outreach (Signed)
Aging Gracefully Program  RN Visit  07/13/2022  Nancy Thomas 11-May-1951 [id number]  Visit:   RN aging Gracefully home visit #2  Start Time:   1030 End Time:   1130 Total Minutes:   60  Readiness To Change Score:     Universal RN Interventions: Calendar Distribution: Yes Exercise Review: Yes Medications: Yes Medication Changes: No Mood: Yes Pain: Yes PCP Advocacy/Support: No Fall Prevention: Yes Incontinence: No Clinician View Of Client Situation: Ambulatory with cane to the door. Moving slowly. Appears happy.  Lift chair in the upright position when I arrived. Client View Of His/Her Situation: Reports she has not heard from [employer] as of yet.  Healthcare Provider Communication: Did Higher education careers adviser With [employer] Provider?: No According to Client, Did PCP Report Communication With An Aging Gracefully RN?: No  Clinician View of Client Situation: Clinician View Of Client Situation: Ambulatory with cane to the door. Moving slowly. Appears happy.  Lift chair in the upright position when I arrived. Client's View of His/Her Situation: Client View Of His/Her Situation: Reports she has not heard from [employer] as of yet.  Medication Assessment: denies any changes to medications  OT Update: pending community housing solutions assessment  Session Summary: dong well. Appears to be happier than the last visit.    Goals Addressed               This Visit's Progress     Aging Gracefully RN (pt-stated)        Goals: Patient will report decrease in pain in back and knees in the next 120 days.  06/22/2022 Assessment: pending MRI results of back. C/O pain in the back in knees.  Reports difficulty walking at times.Uses a wheelchair to walk behind. Reports 1 fall about 2-3 weeks ago. Family assisted client with  getting up out of the floor.  Interventions: reviewed fall prevention. Reviewed importance of follow up with MD.  Plan: follow up in 1  month.  [name] RN, BSN, CEN RN Case Freight forwarder for [employer] Mobile: [phone]    07/13/2022 Assessment: walking slowly with cane.  Chair in upright position. Reports that she has started water exercises with a coach and her back and knee pain have decreased.   Interventions: encouraged patient to continue her water exercise. Demonstrated AG home exercise plan. Encouraged patient to exercise daily.  Plan: Next home visit planned for 08/10/2022  [name] RN, BSN, CEN RN Case Manager for [place] Mobile: [phone]        Aging Gracefully RN (pt-stated)        Goal:  Patient will report improved sleep schedule in the next 120 days.  06/22/2022 Assessment: Reviewed patients concern for her sleeping schedule. Patient sleeps during the day and is up at night.   Interventions: reviewed with patient her current sleep schedule fosters loneliness. Reviewed importance of being awake during daytime hours to be able to go to the senior center and be social.  Encouraged patient to back up hr bedtime by 30 minutes per week to get on a better schedule. Reviewed being active will also help her be more sleepy.  Review OTC melatonin. Encouraged patient to do pool exercises to lessen the pounding on her joints.  PLAN:Next home visit planned for  07/13/2022  [name] RN, BSN, CEN RN Case Manager for [place] [employer]: [phone]   07/13/2022 Assessment: Patient reports that she is not be consistent with trying  to change her sleep schedule.  She does report being very tired after her water exercises.  Interventions: Again reviewed ways to achieve a better sleep schedule. Encouraged patient to work on going to bed earlier and getting up earlier.   Plan: next home visit planned for 08/10/2022.  [name] RN, BSN, Careers information officer for [employer] Mobile: [phone]         [name] RN, BSN, Careers information officer for [employer] Mobile: [phone]

## 2022-07-13 NOTE — Patient Instructions (Signed)
Visit Information  Thank you for taking time to visit with me today. Please don't hesitate to contact me if I can be of assistance to you before our next scheduled telephone appointment.  Following are the goals we discussed today:   Goals Addressed               This Visit's Progress     Aging Gracefully RN (pt-stated)        Goals: Patient will report decrease in pain in back and knees in the next 120 days.  06/22/2022 Assessment: pending MRI results of back. C/O pain in the back in knees.  Reports difficulty walking at times.Uses a wheelchair to walk behind. Reports 1 fall about 2-3 weeks ago. Family assisted client with  getting up out of the floor.  Interventions: reviewed fall prevention. Reviewed importance of follow up with MD.  Plan: follow up in 1 month.  [name] RN, BSN, CEN RN Case Freight forwarder for [employer] Mobile: [phone]    07/13/2022 Assessment: walking slowly with cane.  Chair in upright position. Reports that she has started water exercises with a coach and her back and knee pain have decreased.   Interventions: encouraged patient to continue her water exercise. Demonstrated AG home exercise plan. Encouraged patient to exercise daily.  Plan: Next home visit planned for 08/10/2022  [name] RN, BSN, CEN RN Case Manager for [place] Mobile: [phone]        Aging Gracefully RN (pt-stated)        Goal:  Patient will report improved sleep schedule in the next 120 days.  06/22/2022 Assessment: Reviewed patients concern for her sleeping schedule. Patient sleeps during the day and is up at night.   Interventions: reviewed with patient her current sleep schedule fosters loneliness. Reviewed importance of being awake during daytime hours to be able to go to the senior center and be social.  Encouraged patient to back up hr bedtime by 30 minutes per week to get on a better schedule.  Reviewed being active will also help her be more sleepy.  Review OTC melatonin. Encouraged patient to do pool exercises to lessen the pounding on her joints.  PLAN:Next home visit planned for  07/13/2022  [name] RN, BSN, CEN RN Case Manager for [place] Mobile: [phone]   07/13/2022 Assessment: Patient reports that she is not be consistent with trying to change her sleep schedule.  She does report being very tired after her water exercises.  Interventions: Again reviewed ways to achieve a better sleep schedule. Encouraged patient to work on going to bed earlier and getting up earlier.   Plan: next home visit planned for 08/10/2022.  [name] RN, BSN, CEN RN Case Freight forwarder for [employer] [place] [employer] Mobile: [phone]          Our next appointment is  in person at patients home  on 08/10/2022 at 1030  Please call the care guide team at [phone] if you need to cancel or reschedule your appointment.   If you are experiencing a Mental Health or [place] or need someone to talk to, please call the Suicide and Crisis Lifeline: 988 call the [place] National Suicide Prevention Lifeline: 437-744-3145 or TTY: (530)620-3282 TTY 732-511-0901) to talk to a trained counselor call 1-800-273-TALK (toll free, 24 hour hotline) go to [facility] [address] [place] [phone]) call 911   The patient verbalized  understanding of instructions, educational materials, and care plan provided today and agreed to receive a mailed copy of patient instructions, educational materials, and care plan.   [name] RN, BSN, Careers information officer for [employer] Mobile: [phone]

## 2022-07-15 DIAGNOSIS — L243 Irritant contact dermatitis due to cosmetics: Secondary | ICD-10-CM | POA: Diagnosis not present
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
## 2022-07-21 ENCOUNTER — Ambulatory Visit: Payer: Medicare HMO | Admitting: Physical Therapy

## 2022-07-21 ENCOUNTER — Encounter: Payer: Self-pay | Admitting: Physical Therapy

## 2022-07-21 DIAGNOSIS — M5459 Other low back pain: Secondary | ICD-10-CM | POA: Diagnosis not present

## 2022-07-21 DIAGNOSIS — R2689 Other abnormalities of gait and mobility: Secondary | ICD-10-CM | POA: Diagnosis not present

## 2022-07-21 DIAGNOSIS — R252 Cramp and spasm: Secondary | ICD-10-CM

## 2022-07-21 DIAGNOSIS — M6281 Muscle weakness (generalized): Secondary | ICD-10-CM | POA: Diagnosis not present

## 2022-07-21 DIAGNOSIS — M545 Low back pain, unspecified: Secondary | ICD-10-CM | POA: Diagnosis not present

## 2022-07-28 ENCOUNTER — Encounter: Payer: Self-pay | Admitting: Physical Therapy

## 2022-07-28 ENCOUNTER — Ambulatory Visit: Payer: Medicare HMO | Attending: Anesthesiology | Admitting: Physical Therapy

## 2022-07-28 DIAGNOSIS — M6281 Muscle weakness (generalized): Secondary | ICD-10-CM | POA: Diagnosis not present

## 2022-07-28 DIAGNOSIS — R252 Cramp and spasm: Secondary | ICD-10-CM | POA: Diagnosis not present

## 2022-07-28 DIAGNOSIS — M5459 Other low back pain: Secondary | ICD-10-CM | POA: Diagnosis not present

## 2022-07-28 DIAGNOSIS — R2689 Other abnormalities of gait and mobility: Secondary | ICD-10-CM | POA: Insufficient documentation
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
## 2022-07-30 ENCOUNTER — Ambulatory Visit (HOSPITAL_BASED_OUTPATIENT_CLINIC_OR_DEPARTMENT_OTHER): Payer: Medicare HMO | Attending: Anesthesiology | Admitting: Physical Therapy

## 2022-07-30 ENCOUNTER — Encounter (HOSPITAL_BASED_OUTPATIENT_CLINIC_OR_DEPARTMENT_OTHER): Payer: Self-pay | Admitting: Physical Therapy

## 2022-07-30 DIAGNOSIS — M545 Low back pain, unspecified: Secondary | ICD-10-CM | POA: Diagnosis not present

## 2022-07-30 DIAGNOSIS — R2689 Other abnormalities of gait and mobility: Secondary | ICD-10-CM | POA: Insufficient documentation

## 2022-07-30 DIAGNOSIS — M5136 Other intervertebral disc degeneration, lumbar region: Secondary | ICD-10-CM | POA: Insufficient documentation

## 2022-07-30 DIAGNOSIS — R293 Abnormal posture: Secondary | ICD-10-CM | POA: Insufficient documentation

## 2022-07-30 DIAGNOSIS — Z7901 Long term (current) use of anticoagulants: Secondary | ICD-10-CM | POA: Insufficient documentation

## 2022-07-30 DIAGNOSIS — M6281 Muscle weakness (generalized): Secondary | ICD-10-CM

## 2022-07-30 DIAGNOSIS — M5459 Other low back pain: Secondary | ICD-10-CM

## 2022-07-30 DIAGNOSIS — M62838 Other muscle spasm: Secondary | ICD-10-CM | POA: Diagnosis not present

## 2022-07-30 DIAGNOSIS — I4891 Unspecified atrial fibrillation: Secondary | ICD-10-CM | POA: Insufficient documentation

## 2022-07-30 DIAGNOSIS — M1990 Unspecified osteoarthritis, unspecified site: Secondary | ICD-10-CM | POA: Insufficient documentation

## 2022-07-30 DIAGNOSIS — R252 Cramp and spasm: Secondary | ICD-10-CM
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
## 2022-08-03 ENCOUNTER — Ambulatory Visit (HOSPITAL_BASED_OUTPATIENT_CLINIC_OR_DEPARTMENT_OTHER): Payer: Medicare HMO | Admitting: Physical Therapy

## 2022-08-03 ENCOUNTER — Encounter (HOSPITAL_BASED_OUTPATIENT_CLINIC_OR_DEPARTMENT_OTHER): Payer: Self-pay | Admitting: Physical Therapy

## 2022-08-03 DIAGNOSIS — I4891 Unspecified atrial fibrillation: Secondary | ICD-10-CM | POA: Diagnosis not present

## 2022-08-03 DIAGNOSIS — Z7901 Long term (current) use of anticoagulants: Secondary | ICD-10-CM | POA: Diagnosis not present

## 2022-08-03 DIAGNOSIS — M6281 Muscle weakness (generalized): Secondary | ICD-10-CM

## 2022-08-03 DIAGNOSIS — M545 Low back pain, unspecified: Secondary | ICD-10-CM | POA: Diagnosis not present

## 2022-08-03 DIAGNOSIS — R2689 Other abnormalities of gait and mobility: Secondary | ICD-10-CM

## 2022-08-03 DIAGNOSIS — M62838 Other muscle spasm: Secondary | ICD-10-CM | POA: Diagnosis not present

## 2022-08-03 DIAGNOSIS — M5459 Other low back pain: Secondary | ICD-10-CM

## 2022-08-03 DIAGNOSIS — M1990 Unspecified osteoarthritis, unspecified site: Secondary | ICD-10-CM | POA: Diagnosis not present

## 2022-08-03 DIAGNOSIS — R252 Cramp and spasm: Secondary | ICD-10-CM

## 2022-08-03 DIAGNOSIS — M5136 Other intervertebral disc degeneration, lumbar region: Secondary | ICD-10-CM | POA: Diagnosis not present

## 2022-08-03 DIAGNOSIS — R293 Abnormal posture: Secondary | ICD-10-CM | POA: Diagnosis not present

## 2022-08-04 ENCOUNTER — Other Ambulatory Visit: Payer: Self-pay | Admitting: Occupational Therapy

## 2022-08-04 NOTE — Patient Instructions (Addendum)
Ease of picking up objects off the floor (reacher) MET  ACTION PLANNING - DRESSING/BATHING Target Problem Area: Difficulty with lower body dressing/reaching items up high/down low  Why Problem May Occur: Decreased flexibility  Target Goal: Increased independence with putting on and taking off socks  STRATEGIES Saving Your Energy: DO: DON'T:  Sit down to do tasks  Stand too long while dressing  Use appropriate adaptive equipment:  sock aid, reacher   Keep all items you'll need within easy reach/gather all items at once    Simplifying the way you set up tasks or daily routines: DO: DON'T:  Wear sturdy shoes that grip the floor   Gather all items before getting started   Wear clothing that is easily manipulated Wear clothes you can get tripped up on.   Practice It is important to practice the strategies so we can determine if they will be effective in helping to reach your goal. Follow these specific recommendations: 1.Use the sock aid and reacher so you don't have to struggle to get socks on and off, use them consistently to get used to them. 2. Be aware that heavier items and plastic wrapped items may not work well with reacher.  If a strategy does not work the first time, try it again and again (and maybe again). We may make some changes over the next few sessions, based on how they work.  Golden Circle, OTR/L        08/04/2022  Ease of getting socks on and off (reacher and wide sock aide) MET  ACTION PLANNING - DRESSING/BATHING Target Problem Area: Difficulty with lower body dressing/reaching items up high/down low  Why Problem May Occur: Decreased flexibility  Target Goal: Increased independence with putting on and taking off socks  STRATEGIES Saving Your Energy: DO: DON'T:  Sit down to do tasks  Stand too long while dressing  Use appropriate adaptive equipment:  sock aid, reacher   Keep all items you'll need within easy reach/gather all items at once    Simplifying  the way you set up tasks or daily routines: DO: DON'T:  Wear sturdy shoes that grip the floor   Gather all items before getting started   Wear clothing that is easily manipulated Wear clothes you can get tripped up on.   Practice It is important to practice the strategies so we can determine if they will be effective in helping to reach your goal. Follow these specific recommendations: 1.Use the sock aid and reacher so you don't have to struggle to get socks on and off, use them consistently to get used to them. 2. Be aware that heavier items and plastic wrapped items may not work well with reacher.  If a strategy does not work the first time, try it again and again (and maybe again). We may make some changes over the next few sessions, based on how they work.  Golden Circle, OTR/L        08/04/2022

## 2022-08-04 NOTE — Patient Outreach (Signed)
[facility]  OT Follow-Up Visit  08/04/2022  Nancy Thomas Jul 22, 1951 [id number]  Visit:  2- Second Visit  Start Time:  1400 End Time:  1500 Total Minutes:  60   Readiness to Change Score :  Readiness to Change Score: 10   Durable Medical Equipment: Adaptive Equipment: Sock Aid, Reacher Adaptive Equipment Distribution Date: 08/04/22   Goals:   Goals Addressed             This Visit's Progress    COMPLETED: Patient Stated       Ease of picking up objects off the floor (reacher) MET  ACTION PLANNING - DRESSING/BATHING Target Problem Area: Difficulty with lower body dressing/reaching items up high/down low  Why Problem May Occur: Decreased flexibility  Target Goal: Increased independence with putting on and taking off socks  STRATEGIES Saving Your Energy: DO: DON'T:  Sit down to do tasks  Stand too long while dressing  Use appropriate adaptive equipment:  sock aid, reacher   Keep all items you'll need within easy reach/gather all items at once   Simplifying the way you set up tasks or daily routines: DO: DON'T:  Wear sturdy shoes that grip the floor   Gather all items before getting started   Wear clothing that is easily manipulated Wear clothes you can get tripped up on.   Practice It is important to practice the strategies so we can determine if they will be effective in helping to reach your goal. Follow these specific recommendations: 1.Use the sock aid and reacher so you don't have to struggle to get socks on and off, use them consistently to get used to them. 2. Be aware that heavier items and plastic wrapped items may not work well with reacher.  If a strategy does not work the first time, try it again and again (and maybe again). We may make some changes over the next few sessions, based on how they work.  Golden Circle, OTR/L        08/04/2022     COMPLETED: Patient Stated       Ease of getting socks on and off (reacher and wide sock  aide) MET  ACTION PLANNING - DRESSING/BATHING Target Problem Area: Difficulty with lower body dressing/reaching items up high/down low  Why Problem May Occur: Decreased flexibility  Target Goal: Increased independence with putting on and taking off socks  STRATEGIES Saving Your Energy: DO: DON'T:  Sit down to do tasks  Stand too long while dressing  Use appropriate adaptive equipment:  sock aid, reacher   Keep all items you'll need within easy reach/gather all items at once   Simplifying the way you set up tasks or daily routines: DO: DON'T:  Wear sturdy shoes that grip the floor   Gather all items before getting started   Wear clothing that is easily manipulated Wear clothes you can get tripped up on.   Practice It is important to practice the strategies so we can determine if they will be effective in helping to reach your goal. Follow these specific recommendations: 1.Use the sock aid and reacher so you don't have to struggle to get socks on and off, use them consistently to get used to them. 2. Be aware that heavier items and plastic wrapped items may not work well with reacher.  If a strategy does not work the first time, try it again and again (and maybe again). We may make some changes over the next few sessions, based on how they work.  Golden Circle, OTR/L        08/04/2022        Post Clinical Reasoning: Client Action (Goal) Three Interventions: Client used the reacher to try and get packages of toilet paper up on her laundry shelf and items down from her laundry self. Getting them up the reacher was not as helpful because it did not work as well with items in plastic. Getting items down was not an issue. Did Client Try?: Yes Targeted Problem Area Status: A Little Better Client Action (Goal) Four Interventions: Client used wide sock aid to donn socks and reacher to doff socks Did Client Try?: Yes Targeted Problem Area Status: A Little Better Clinician View Of  Client Situation:: Ms. Wolaver continues to take care of herself. Currently she is taking aquatic therapy for her right hip/SI joint as well as trying to walk around her cul-de--sac. She appreciated getting the sock aid and reacher. She was wondering when the work may start on her home. Client View Of His/Her Situation:: Pt states she is doing pretty well overall and is glad to be in aquatic therapy. She is disappointed that the AG process started in August of 2023 and no work has been completed. Next Visit Plan:: built handles for sock aid and educational materials  Golden Circle, OTR/L [place] [facility] [phone] Office [phone]
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
## 2022-08-06 ENCOUNTER — Encounter (HOSPITAL_BASED_OUTPATIENT_CLINIC_OR_DEPARTMENT_OTHER): Payer: Self-pay | Admitting: Physical Therapy

## 2022-08-06 ENCOUNTER — Ambulatory Visit (HOSPITAL_BASED_OUTPATIENT_CLINIC_OR_DEPARTMENT_OTHER): Payer: Medicare HMO | Admitting: Physical Therapy

## 2022-08-06 DIAGNOSIS — R252 Cramp and spasm: Secondary | ICD-10-CM

## 2022-08-06 DIAGNOSIS — M62838 Other muscle spasm: Secondary | ICD-10-CM | POA: Diagnosis not present

## 2022-08-06 DIAGNOSIS — R293 Abnormal posture: Secondary | ICD-10-CM | POA: Diagnosis not present

## 2022-08-06 DIAGNOSIS — Z7901 Long term (current) use of anticoagulants: Secondary | ICD-10-CM | POA: Diagnosis not present

## 2022-08-06 DIAGNOSIS — M5459 Other low back pain: Secondary | ICD-10-CM

## 2022-08-06 DIAGNOSIS — R2689 Other abnormalities of gait and mobility: Secondary | ICD-10-CM

## 2022-08-06 DIAGNOSIS — M1990 Unspecified osteoarthritis, unspecified site: Secondary | ICD-10-CM | POA: Diagnosis not present

## 2022-08-06 DIAGNOSIS — M6281 Muscle weakness (generalized): Secondary | ICD-10-CM

## 2022-08-06 DIAGNOSIS — M5136 Other intervertebral disc degeneration, lumbar region: Secondary | ICD-10-CM | POA: Diagnosis not present

## 2022-08-06 DIAGNOSIS — I4891 Unspecified atrial fibrillation: Secondary | ICD-10-CM | POA: Diagnosis not present

## 2022-08-06 DIAGNOSIS — M545 Low back pain, unspecified: Secondary | ICD-10-CM | POA: Diagnosis not present

## 2022-08-09 DIAGNOSIS — I4891 Unspecified atrial fibrillation: Secondary | ICD-10-CM | POA: Diagnosis not present

## 2022-08-09 DIAGNOSIS — Z7901 Long term (current) use of anticoagulants: Secondary | ICD-10-CM | POA: Diagnosis not present
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
## 2022-08-10 ENCOUNTER — Ambulatory Visit (HOSPITAL_BASED_OUTPATIENT_CLINIC_OR_DEPARTMENT_OTHER): Payer: Medicare HMO | Admitting: Physical Therapy

## 2022-08-10 ENCOUNTER — Other Ambulatory Visit: Payer: Self-pay

## 2022-08-10 ENCOUNTER — Encounter (HOSPITAL_BASED_OUTPATIENT_CLINIC_OR_DEPARTMENT_OTHER): Payer: Self-pay | Admitting: Physical Therapy

## 2022-08-10 DIAGNOSIS — M1990 Unspecified osteoarthritis, unspecified site: Secondary | ICD-10-CM | POA: Diagnosis not present

## 2022-08-10 DIAGNOSIS — R2689 Other abnormalities of gait and mobility: Secondary | ICD-10-CM | POA: Diagnosis not present

## 2022-08-10 DIAGNOSIS — M5136 Other intervertebral disc degeneration, lumbar region: Secondary | ICD-10-CM | POA: Diagnosis not present

## 2022-08-10 DIAGNOSIS — R252 Cramp and spasm: Secondary | ICD-10-CM

## 2022-08-10 DIAGNOSIS — M5459 Other low back pain: Secondary | ICD-10-CM

## 2022-08-10 DIAGNOSIS — R293 Abnormal posture: Secondary | ICD-10-CM | POA: Diagnosis not present

## 2022-08-10 DIAGNOSIS — Z7901 Long term (current) use of anticoagulants: Secondary | ICD-10-CM | POA: Diagnosis not present

## 2022-08-10 DIAGNOSIS — M62838 Other muscle spasm: Secondary | ICD-10-CM | POA: Diagnosis not present

## 2022-08-10 DIAGNOSIS — M545 Low back pain, unspecified: Secondary | ICD-10-CM | POA: Diagnosis not present

## 2022-08-10 DIAGNOSIS — M6281 Muscle weakness (generalized): Secondary | ICD-10-CM

## 2022-08-10 DIAGNOSIS — I4891 Unspecified atrial fibrillation: Secondary | ICD-10-CM | POA: Diagnosis not present

## 2022-08-10 NOTE — Patient Instructions (Signed)
Visit Information  Thank you for taking time to visit with me today. Please don't hesitate to contact me if I can be of assistance to you before our next scheduled telephone appointment.  Following are the goals we discussed today:   Goals Addressed               This Visit's Progress     Aging Gracefully RN (pt-stated)        Goals: Patient will report decrease in pain in back and knees in the next 120 days.  06/22/2022 Assessment: pending MRI results of back. C/O pain in the back in knees.  Reports difficulty walking at times.Uses a wheelchair to walk behind. Reports 1 fall about 2-3 weeks ago. Family assisted client with  getting up out of the floor.  Interventions: reviewed fall prevention. Reviewed importance of follow up with MD.  Plan: follow up in 1 month.  [name] RN, BSN, CEN RN Case Freight forwarder for [employer] Mobile: [phone]    07/13/2022 Assessment: walking slowly with cane.  Chair in upright position. Reports that she has started water exercises with a coach and her back and knee pain have decreased.   Interventions: encouraged patient to continue her water exercise. Demonstrated AG home exercise plan. Encouraged patient to exercise daily.  Plan: Next home visit planned for 08/10/2022  [name] RN, BSN, CEN RN Case Manager for [facility] Mobile: [phone]    08/10/2022 Assessment: Patient reports decrease in pain today. Reports water exercises are helping. Interventions:  Encouraged patient to continue to do her water exercises and her home AG exercise plan. Plan: follow up in 1 month.  [name] RN, BSN, Careers information officer for [place] Mobile: [phone]       Aging Gracefully RN (pt-stated)        Goal:  Patient will report improved sleep schedule in the next 120 days.  06/22/2022 Assessment: Reviewed patients concern for her sleeping  schedule. Patient sleeps during the day and is up at night.   Interventions: reviewed with patient her current sleep schedule fosters loneliness. Reviewed importance of being awake during daytime hours to be able to go to the senior center and be social.  Encouraged patient to back up hr bedtime by 30 minutes per week to get on a better schedule. Reviewed being active will also help her be more sleepy.  Review OTC melatonin. Encouraged patient to do pool exercises to lessen the pounding on her joints.  PLAN:Next home visit planned for  07/13/2022  [name] RN, BSN, CEN RN Case Manager for [facility] Mobile: [phone]   07/13/2022 Assessment: Patient reports that she is not be consistent with trying to change her sleep schedule.  She does report being very tired after her water exercises.  Interventions: Again reviewed ways to achieve a better sleep schedule. Encouraged patient to work on going to bed earlier and getting up earlier.   Plan: next home visit planned for 08/10/2022.  [name] RN, BSN, CEN RN Case Freight forwarder for [place] Mobile: [phone]   08/10/2022 Assessment: Patient concerned about interaction of melatonin and coumadin.  She has spoke with MD.  MD suggest she continue to take her melatonin and try to get on a sleep schedule.   Interventions: Reviewed and encouraged a sleep schedule. Reviewed benefits of being up during the day and asleep at night. Plan: Follow up planned  for 1 month.  [name] RN, BSN, CEN RN Case Freight forwarder for [facility] [employer] Mobile: [phone]          Our next appointment is  in person  on 09/09/2022 at 67   If you are experiencing a Mental Health or [place] or need someone to talk to, please call the Suicide and Crisis Lifeline: 988 call the [place] National Suicide Prevention Lifeline: 989-746-4943 or TTY: 4708011655  TTY (703)739-7980) to talk to a trained counselor call 1-800-273-TALK (toll free, 24 hour hotline) go to [facility] [place] ([phone]) call 911   The patient verbalized understanding of instructions, educational materials, and care plan provided today and agreed to receive a mailed copy of patient instructions, educational materials, and care plan.   [name] RN, BSN, Careers information officer for [employer] Mobile: [phone]

## 2022-08-10 NOTE — Patient Outreach (Signed)
Aging Gracefully Program  RN Visit  08/10/2022  Nancy Thomas 06-24-1951 [id number]  Visit:   Rn home visit #3  Start Time:   1030 End Time:   1130 Total Minutes:   60  Readiness To Change Score:     Universal RN Interventions: Calendar Distribution: Yes Exercise Review: Yes Medications: Yes Medication Changes: No Mood: Yes Pain: Yes PCP Advocacy/Support: No Fall Prevention: Yes Incontinence: No Clinician View Of Client Situation: Patient ambulatory to door. Well dressed . Well groomed. Client View Of His/Her Situation: Patient reports that she is sad today. Reports that her son was visiting every 2 weeks and now it is not coming this week.  Reports she gets lonely.  Paitent  reports that water exercises are still going very well. Reports she is feeling stronger. She is also doing her daily AG exercises. No recent falls and no changes in medications. Patient reports that she spoke with MD about her sleeping and MD encouraged patient to continue with her melatonin to help her sleeping concerns.  Healthcare Provider Communication: Did Higher education careers adviser With [employer] Provider?: No According to Client, Did PCP Report Communication With An Aging Gracefully RN?: No  Clinician View of Client Situation: Clinician View Of Client Situation: Patient ambulatory to door. Well dressed . Well groomed. Client's View of His/Her Situation: Client View Of His/Her Situation: Patient reports that she is sad today. Reports that her son was visiting every 2 weeks and now it is not coming this week.  Reports she gets lonely.  Paitent  reports that water exercises are still going very well. Reports she is feeling stronger. She is also doing her daily AG exercises. No recent falls and no changes in medications. Patient reports that she spoke with MD about her sleeping and MD encouraged patient to continue with her melatonin to help her sleeping concerns.  Medication Assessment:no changes to  medication    OT Update: Patient reports that she has not heard from [employer] and she wants a call from them . I will send an email and ask for patient to be called with an update.  Session Summary: Overall, I think patient is doing better.    Goals Addressed               This Visit's Progress     Aging Gracefully RN (pt-stated)        Goals: Patient will report decrease in pain in back and knees in the next 120 days.  06/22/2022 Assessment: pending MRI results of back. C/O pain in the back in knees.  Reports difficulty walking at times.Uses a wheelchair to walk behind. Reports 1 fall about 2-3 weeks ago. Family assisted client with  getting up out of the floor.  Interventions: reviewed fall prevention. Reviewed importance of follow up with MD.  Plan: follow up in 1 month.  [name] RN, BSN, CEN RN Case Freight forwarder for [employer] Mobile: [phone]    07/13/2022 Assessment: walking slowly with cane.  Chair in upright position. Reports that she has started water exercises with a coach and her back and knee pain have decreased.   Interventions: encouraged patient to continue her water exercise. Demonstrated AG home exercise plan. Encouraged patient to exercise daily.  Plan: Next home visit planned for 08/10/2022  [name] RN, BSN, CEN RN Case Manager for [place] Mobile: [phone]    08/10/2022 Assessment: Patient reports decrease in pain today. Reports water exercises are helping.  Interventions:  Encouraged patient to continue to do her water exercises and her home AG exercise plan. Plan: follow up in 1 month.  [name] RN, BSN, Careers information officer for [place] Mobile: [phone]       Aging Gracefully RN (pt-stated)        Goal:  Patient will report improved sleep schedule in the next 120 days.  06/22/2022 Assessment: Reviewed  patients concern for her sleeping schedule. Patient sleeps during the day and is up at night.   Interventions: reviewed with patient her current sleep schedule fosters loneliness. Reviewed importance of being awake during daytime hours to be able to go to the senior center and be social.  Encouraged patient to back up hr bedtime by 30 minutes per week to get on a better schedule. Reviewed being active will also help her be more sleepy.  Review OTC melatonin. Encouraged patient to do pool exercises to lessen the pounding on her joints.  PLAN:Next home visit planned for  07/13/2022  [name] RN, BSN, CEN RN Case Manager for [place] Mobile: [phone]   07/13/2022 Assessment: Patient reports that she is not be consistent with trying to change her sleep schedule.  She does report being very tired after her water exercises.  Interventions: Again reviewed ways to achieve a better sleep schedule. Encouraged patient to work on going to bed earlier and getting up earlier.   Plan: next home visit planned for 08/10/2022.  [name] RN, BSN, CEN RN Case Freight forwarder for [place] Mobile: [phone]   08/10/2022 Assessment: Patient concerned about interaction of melatonin and coumadin.  She has spoke with MD.  MD suggest she continue to take her melatonin and try to get on a sleep schedule.   Interventions: Reviewed and encouraged a sleep schedule. Reviewed benefits of being up during the day and asleep at night. Plan: Follow up planned for 1 month.  [name] RN, BSN, Careers information officer for [employer] Mobile: [phone]       [name] RN, BSN, Careers information officer for [employer] Mobile: [phone]

## 2022-08-13 ENCOUNTER — Ambulatory Visit (HOSPITAL_BASED_OUTPATIENT_CLINIC_OR_DEPARTMENT_OTHER): Payer: Medicare HMO | Admitting: Physical Therapy

## 2022-08-16 ENCOUNTER — Ambulatory Visit (HOSPITAL_BASED_OUTPATIENT_CLINIC_OR_DEPARTMENT_OTHER): Payer: Medicare HMO | Admitting: Physical Therapy

## 2022-08-16 ENCOUNTER — Encounter (HOSPITAL_BASED_OUTPATIENT_CLINIC_OR_DEPARTMENT_OTHER): Payer: Self-pay | Admitting: Physical Therapy

## 2022-08-16 DIAGNOSIS — M5136 Other intervertebral disc degeneration, lumbar region: Secondary | ICD-10-CM | POA: Diagnosis not present

## 2022-08-16 DIAGNOSIS — R252 Cramp and spasm: Secondary | ICD-10-CM

## 2022-08-16 DIAGNOSIS — R2689 Other abnormalities of gait and mobility: Secondary | ICD-10-CM

## 2022-08-16 DIAGNOSIS — R293 Abnormal posture: Secondary | ICD-10-CM | POA: Diagnosis not present

## 2022-08-16 DIAGNOSIS — Z7901 Long term (current) use of anticoagulants: Secondary | ICD-10-CM | POA: Diagnosis not present

## 2022-08-16 DIAGNOSIS — M6281 Muscle weakness (generalized): Secondary | ICD-10-CM

## 2022-08-16 DIAGNOSIS — M545 Low back pain, unspecified: Secondary | ICD-10-CM | POA: Diagnosis not present

## 2022-08-16 DIAGNOSIS — M62838 Other muscle spasm: Secondary | ICD-10-CM | POA: Diagnosis not present

## 2022-08-16 DIAGNOSIS — M1990 Unspecified osteoarthritis, unspecified site: Secondary | ICD-10-CM | POA: Diagnosis not present

## 2022-08-16 DIAGNOSIS — I4891 Unspecified atrial fibrillation: Secondary | ICD-10-CM | POA: Diagnosis not present
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
## 2022-08-17 DIAGNOSIS — I4891 Unspecified atrial fibrillation: Secondary | ICD-10-CM | POA: Diagnosis not present

## 2022-08-17 DIAGNOSIS — Z7901 Long term (current) use of anticoagulants: Secondary | ICD-10-CM | POA: Diagnosis not present

## 2022-08-18 ENCOUNTER — Ambulatory Visit: Payer: Medicare HMO | Admitting: Rehabilitative and Restorative Service Providers"

## 2022-08-18 ENCOUNTER — Encounter: Payer: Self-pay | Admitting: Rehabilitative and Restorative Service Providers"

## 2022-08-18 DIAGNOSIS — R2689 Other abnormalities of gait and mobility: Secondary | ICD-10-CM | POA: Diagnosis not present

## 2022-08-18 DIAGNOSIS — M6281 Muscle weakness (generalized): Secondary | ICD-10-CM

## 2022-08-18 DIAGNOSIS — M5459 Other low back pain: Secondary | ICD-10-CM | POA: Diagnosis not present

## 2022-08-18 DIAGNOSIS — R252 Cramp and spasm: Secondary | ICD-10-CM
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
## 2022-08-24 ENCOUNTER — Ambulatory Visit (HOSPITAL_BASED_OUTPATIENT_CLINIC_OR_DEPARTMENT_OTHER): Payer: Medicare HMO | Admitting: Physical Therapy

## 2022-08-24 ENCOUNTER — Encounter (HOSPITAL_BASED_OUTPATIENT_CLINIC_OR_DEPARTMENT_OTHER): Payer: Self-pay | Admitting: Physical Therapy

## 2022-08-24 DIAGNOSIS — M5459 Other low back pain: Secondary | ICD-10-CM

## 2022-08-24 DIAGNOSIS — R2689 Other abnormalities of gait and mobility: Secondary | ICD-10-CM | POA: Diagnosis not present

## 2022-08-24 DIAGNOSIS — M545 Low back pain, unspecified: Secondary | ICD-10-CM | POA: Diagnosis not present

## 2022-08-24 DIAGNOSIS — Z7901 Long term (current) use of anticoagulants: Secondary | ICD-10-CM | POA: Diagnosis not present

## 2022-08-24 DIAGNOSIS — R252 Cramp and spasm: Secondary | ICD-10-CM

## 2022-08-24 DIAGNOSIS — M62838 Other muscle spasm: Secondary | ICD-10-CM | POA: Diagnosis not present

## 2022-08-24 DIAGNOSIS — M1990 Unspecified osteoarthritis, unspecified site: Secondary | ICD-10-CM | POA: Diagnosis not present

## 2022-08-24 DIAGNOSIS — I4891 Unspecified atrial fibrillation: Secondary | ICD-10-CM | POA: Diagnosis not present

## 2022-08-24 DIAGNOSIS — M6281 Muscle weakness (generalized): Secondary | ICD-10-CM

## 2022-08-24 DIAGNOSIS — R293 Abnormal posture: Secondary | ICD-10-CM | POA: Diagnosis not present

## 2022-08-24 DIAGNOSIS — M5136 Other intervertebral disc degeneration, lumbar region: Secondary | ICD-10-CM | POA: Diagnosis not present
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
## 2022-08-27 ENCOUNTER — Encounter: Payer: Self-pay | Admitting: Physical Therapy

## 2022-08-27 ENCOUNTER — Ambulatory Visit: Payer: Medicare HMO | Attending: Anesthesiology | Admitting: Physical Therapy

## 2022-08-27 DIAGNOSIS — M5459 Other low back pain: Secondary | ICD-10-CM | POA: Diagnosis not present

## 2022-08-27 DIAGNOSIS — M6281 Muscle weakness (generalized): Secondary | ICD-10-CM | POA: Insufficient documentation

## 2022-08-27 DIAGNOSIS — R2689 Other abnormalities of gait and mobility: Secondary | ICD-10-CM | POA: Diagnosis not present

## 2022-08-27 DIAGNOSIS — R252 Cramp and spasm: Secondary | ICD-10-CM | POA: Insufficient documentation
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
## 2022-08-31 DIAGNOSIS — Z7901 Long term (current) use of anticoagulants: Secondary | ICD-10-CM | POA: Diagnosis not present

## 2022-08-31 DIAGNOSIS — I4891 Unspecified atrial fibrillation: Secondary | ICD-10-CM | POA: Diagnosis not present

## 2022-09-03 ENCOUNTER — Ambulatory Visit (HOSPITAL_BASED_OUTPATIENT_CLINIC_OR_DEPARTMENT_OTHER): Payer: Medicare HMO | Attending: Anesthesiology | Admitting: Physical Therapy

## 2022-09-03 ENCOUNTER — Encounter (HOSPITAL_BASED_OUTPATIENT_CLINIC_OR_DEPARTMENT_OTHER): Payer: Self-pay | Admitting: Physical Therapy

## 2022-09-03 DIAGNOSIS — R252 Cramp and spasm: Secondary | ICD-10-CM | POA: Diagnosis not present

## 2022-09-03 DIAGNOSIS — M6281 Muscle weakness (generalized): Secondary | ICD-10-CM | POA: Diagnosis not present

## 2022-09-03 DIAGNOSIS — M5459 Other low back pain: Secondary | ICD-10-CM | POA: Insufficient documentation

## 2022-09-03 DIAGNOSIS — R2689 Other abnormalities of gait and mobility: Secondary | ICD-10-CM | POA: Diagnosis not present
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
## 2022-09-07 ENCOUNTER — Other Ambulatory Visit: Payer: Self-pay

## 2022-09-07 NOTE — Patient Outreach (Signed)
Aging Gracefully Program  RN Visit  09/07/2022  Nancy Thomas Nov 03, 1950 [id number]  Visit:  RN Visit Number: 4- Fourth Visit  RN TIME CALCULATION: Start TIme:  RN Start Time Calculation: 1517 End Time:  RN Stop Time Calculation: 6160 Total Minutes:  RN Time Calculation: 45  Readiness To Change Score:     Universal RN Interventions: Calendar Distribution: Yes Exercise Review: Yes Medications: Yes Medication Changes: No Mood: Yes Pain: Yes PCP Advocacy/Support: No Fall Prevention: Yes Incontinence: No Clinician View Of Client Situation: Amblulatory to the door.  Well dressed. in good spirits.  Christmas presents ready for the shelter Client View Of His/Her Situation: Continues  to go to water classes. Reports decreased pain, Reports she is sleeping better. Continues to do home exercises. Has had an assessment from [employer] with [employer].   No medication changes. no falls.  Healthcare Provider Communication: Did Higher education careers adviser With [employer] Provider?: No According to Client, Did PCP Report Communication With An Aging Gracefully RN?: No  Clinician View of Client Situation: Clinician View Of Client Situation: Amblulatory to the door.  Well dressed. in good spirits.  Christmas presents ready for the shelter Client's View of His/Her Situation: Client View Of His/Her Situation: Continues  to go to water classes. Reports decreased pain, Reports she is sleeping better. Continues to do home exercises. Has had an assessment from [employer] with [employer].   No medication changes. no falls.  Medication Assessment:no changes     OT Update: pending home modifications  Session Summary: nursing goals met.   Goals Addressed               This Visit's Progress     COMPLETED: Aging Designer, fashion/clothing (pt-stated)        Goals: Patient will report decrease in pain in back and knees in the next 120 days.  06/22/2022 Assessment: pending  MRI results of back. C/O pain in the back in knees.  Reports difficulty walking at times.Uses a wheelchair to walk behind. Reports 1 fall about 2-3 weeks ago. Family assisted client with  getting up out of the floor.  Interventions: reviewed fall prevention. Reviewed importance of follow up with MD.  Plan: follow up in 1 month.  [name] RN, BSN, CEN RN Case Freight forwarder for [employer] Mobile: [phone]    07/13/2022 Assessment: walking slowly with cane.  Chair in upright position. Reports that she has started water exercises with a coach and her back and knee pain have decreased.   Interventions: encouraged patient to continue her water exercise. Demonstrated AG home exercise plan. Encouraged patient to exercise daily.  Plan: Next home visit planned for 08/10/2022  [name] RN, BSN, CEN RN Case Manager for [place] Mobile: [phone]    08/10/2022 Assessment: Patient reports decrease in pain today. Reports water exercises are helping. Interventions:  Encouraged patient to continue to do her water exercises and her home AG exercise plan. Plan: follow up in 1 month.  [name] RN, BSN, CEN RN Case Freight forwarder for [employer]: [phone]   09/07/2022 Assessment:   pain decreased. Continues to go to water exercises and do home exercises. Stays active with walking daily weather permitting.  Reports much less pain now than when starting program. Reports cold weather and rain bother her joints.  Interventions: encouraged patient to continue to do exercises and stay active.  Plan: goal met.  [name]  RN, BSN, Careers information officer for [place] [employer]: [phone]       COMPLETED: Aging Designer, fashion/clothing (pt-stated)        Goal:  Patient will report improved sleep schedule in the next 120 days.  06/22/2022 Assessment: Reviewed patients  concern for her sleeping schedule. Patient sleeps during the day and is up at night.   Interventions: reviewed with patient her current sleep schedule fosters loneliness. Reviewed importance of being awake during daytime hours to be able to go to the senior center and be social.  Encouraged patient to back up hr bedtime by 30 minutes per week to get on a better schedule. Reviewed being active will also help her be more sleepy.  Review OTC melatonin. Encouraged patient to do pool exercises to lessen the pounding on her joints.  PLAN:Next home visit planned for  07/13/2022  [name] RN, BSN, CEN RN Case Manager for [place] [employer]: [phone]   07/13/2022 Assessment: Patient reports that she is not be consistent with trying to change her sleep schedule.  She does report being very tired after her water exercises.  Interventions: Again reviewed ways to achieve a better sleep schedule. Encouraged patient to work on going to bed earlier and getting up earlier.   Plan: next home visit planned for 08/10/2022.  [name] RN, BSN, CEN RN Case Freight forwarder for [place] [employer]: [phone]   08/10/2022 Assessment: Patient concerned about interaction of melatonin and coumadin.  She has spoke with MD.  MD suggest she continue to take her melatonin and try to get on a sleep schedule.   Interventions: Reviewed and encouraged a sleep schedule. Reviewed benefits of being up during the day and asleep at night. Plan: Follow up planned for 1 month.  [name] RN, BSN, CEN RN Case Freight forwarder for [place] [employer]: [phone]   09/07/2022 Assessment:  Patient is now sleeping better.  Now going to bed between 11-12am.  Sleeps 5-7 hours of sleep per night.  Intervention: Continues to take melatonin and stay on a sleep schedule. Encouraged patient not to fall back into her old schedule.  Plan: Goal  met.  [name] RN, BSN, Careers information officer for [employer] Mobile: [phone]        RN visits completed.  [name] RN, BSN, Careers information officer for [employer] Mobile: [phone]

## 2022-09-07 NOTE — Patient Instructions (Signed)
Visit Information  Thank you for taking time to visit with me today. Please don't hesitate to contact me if I can be of assistance to you before our next scheduled telephone appointment.  Following are the goals we discussed today:   Goals Addressed               This Visit's Progress     COMPLETED: Aging Designer, fashion/clothing (pt-stated)        Goals: Patient will report decrease in pain in back and knees in the next 120 days.  06/22/2022 Assessment: pending MRI results of back. C/O pain in the back in knees.  Reports difficulty walking at times.Uses a wheelchair to walk behind. Reports 1 fall about 2-3 weeks ago. Family assisted client with  getting up out of the floor.  Interventions: reviewed fall prevention. Reviewed importance of follow up with MD.  Plan: follow up in 1 month.  [name] RN, BSN, CEN RN Case Freight forwarder for [facility]: [phone]    07/13/2022 Assessment: walking slowly with cane.  Chair in upright position. Reports that she has started water exercises with a coach and her back and knee pain have decreased.   Interventions: encouraged patient to continue her water exercise. Demonstrated AG home exercise plan. Encouraged patient to exercise daily.  Plan: Next home visit planned for 08/10/2022  [name] RN, BSN, CEN RN Case Manager for [place] [employer]: [phone]    08/10/2022 Assessment: Patient reports decrease in pain today. Reports water exercises are helping. Interventions:  Encouraged patient to continue to do her water exercises and her home AG exercise plan. Plan: follow up in 1 month.  [name] RN, BSN, CEN RN Case Freight forwarder for [facility] [employer]: [phone]   09/07/2022 Assessment:   pain decreased. Continues to go to water exercises and do home exercises. Stays active with walking daily weather permitting.  Reports much less pain  now than when starting program. Reports cold weather and rain bother her joints.  Interventions: encouraged patient to continue to do exercises and stay active.  Plan: goal met.  [name] RN, BSN, Careers information officer for [place] [employer]: [phone]       COMPLETED: Aging Designer, fashion/clothing (pt-stated)        Goal:  Patient will report improved sleep schedule in the next 120 days.  06/22/2022 Assessment: Reviewed patients concern for her sleeping schedule. Patient sleeps during the day and is up at night.   Interventions: reviewed with patient her current sleep schedule fosters loneliness. Reviewed importance of being awake during daytime hours to be able to go to the senior center and be social.  Encouraged patient to back up hr bedtime by 30 minutes per week to get on a better schedule. Reviewed being active will also help her be more sleepy.  Review OTC melatonin. Encouraged patient to do pool exercises to lessen the pounding on her joints.  PLAN:Next home visit planned for  07/13/2022  [name] RN, BSN, CEN RN Case Manager for [place] [employer]: [phone]   07/13/2022 Assessment: Patient reports that she is not be consistent with trying to change her sleep schedule.  She does report being very tired after her water exercises.  Interventions: Again reviewed ways to achieve a better sleep schedule. Encouraged patient to work on going to bed earlier and getting up earlier.   Plan: next home visit planned for  08/10/2022.  [name] RN, BSN, CEN RN Case Freight forwarder for [place] [employer]: [phone]   08/10/2022 Assessment: Patient concerned about interaction of melatonin and coumadin.  She has spoke with MD.  MD suggest she continue to take her melatonin and try to get on a sleep schedule.   Interventions: Reviewed and encouraged a sleep schedule. Reviewed benefits of being up  during the day and asleep at night. Plan: Follow up planned for 1 month.  [name] RN, BSN, CEN RN Case Freight forwarder for [place] [employer]: [phone]   09/07/2022 Assessment:  Patient is now sleeping better.  Now going to bed between 11-12am.  Sleeps 5-7 hours of sleep per night.  Intervention: Continues to take melatonin and stay on a sleep schedule. Encouraged patient not to fall back into her old schedule.  Plan: Goal met.  [name] RN, BSN, CEN RN Case Freight forwarder for [place] [employer]: [phone]         If you are experiencing a Mental Health or [place] or need someone to talk to, please call the Suicide and Crisis Lifeline: 988 call the [place] National Suicide Prevention Lifeline: 385-298-9301 or TTY: 6707552463 TTY 323-179-3856) to talk to a trained counselor call 1-800-273-TALK (toll free, 24 hour hotline) go to [facility] [address] [place] [phone]) call 911   The patient verbalized understanding of instructions, educational materials, and care plan provided today and agreed to receive a mailed copy of patient instructions, educational materials, and care plan.   [name] RN, BSN, Careers information officer for [employer] Mobile: [phone]

## 2022-09-08 ENCOUNTER — Encounter (HOSPITAL_BASED_OUTPATIENT_CLINIC_OR_DEPARTMENT_OTHER): Payer: Self-pay | Admitting: Physical Therapy

## 2022-09-08 ENCOUNTER — Ambulatory Visit (HOSPITAL_BASED_OUTPATIENT_CLINIC_OR_DEPARTMENT_OTHER): Payer: Medicare HMO | Admitting: Physical Therapy

## 2022-09-08 DIAGNOSIS — R252 Cramp and spasm: Secondary | ICD-10-CM

## 2022-09-08 DIAGNOSIS — R2689 Other abnormalities of gait and mobility: Secondary | ICD-10-CM | POA: Diagnosis not present

## 2022-09-08 DIAGNOSIS — M5459 Other low back pain: Secondary | ICD-10-CM

## 2022-09-08 DIAGNOSIS — M6281 Muscle weakness (generalized): Secondary | ICD-10-CM | POA: Diagnosis not present
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
## 2022-09-14 ENCOUNTER — Ambulatory Visit (HOSPITAL_BASED_OUTPATIENT_CLINIC_OR_DEPARTMENT_OTHER): Payer: Medicare HMO | Admitting: Physical Therapy

## 2022-09-14 ENCOUNTER — Encounter (HOSPITAL_BASED_OUTPATIENT_CLINIC_OR_DEPARTMENT_OTHER): Payer: Self-pay | Admitting: Physical Therapy

## 2022-09-14 DIAGNOSIS — R252 Cramp and spasm: Secondary | ICD-10-CM | POA: Diagnosis not present

## 2022-09-14 DIAGNOSIS — Z7901 Long term (current) use of anticoagulants: Secondary | ICD-10-CM | POA: Diagnosis not present

## 2022-09-14 DIAGNOSIS — M6281 Muscle weakness (generalized): Secondary | ICD-10-CM | POA: Diagnosis not present

## 2022-09-14 DIAGNOSIS — M5459 Other low back pain: Secondary | ICD-10-CM | POA: Diagnosis not present

## 2022-09-14 DIAGNOSIS — I4891 Unspecified atrial fibrillation: Secondary | ICD-10-CM | POA: Diagnosis not present

## 2022-09-14 DIAGNOSIS — R2689 Other abnormalities of gait and mobility: Secondary | ICD-10-CM
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
## 2022-09-16 ENCOUNTER — Ambulatory Visit (HOSPITAL_BASED_OUTPATIENT_CLINIC_OR_DEPARTMENT_OTHER): Payer: Medicare HMO | Admitting: Physical Therapy

## 2022-09-21 ENCOUNTER — Encounter (HOSPITAL_BASED_OUTPATIENT_CLINIC_OR_DEPARTMENT_OTHER): Payer: Self-pay | Admitting: Physical Therapy

## 2022-09-21 ENCOUNTER — Ambulatory Visit (HOSPITAL_BASED_OUTPATIENT_CLINIC_OR_DEPARTMENT_OTHER): Payer: Medicare HMO | Admitting: Physical Therapy

## 2022-09-21 DIAGNOSIS — R2689 Other abnormalities of gait and mobility: Secondary | ICD-10-CM | POA: Diagnosis not present

## 2022-09-21 DIAGNOSIS — M5459 Other low back pain: Secondary | ICD-10-CM | POA: Diagnosis not present

## 2022-09-21 DIAGNOSIS — M6281 Muscle weakness (generalized): Secondary | ICD-10-CM

## 2022-09-21 DIAGNOSIS — R252 Cramp and spasm: Secondary | ICD-10-CM | POA: Diagnosis not present
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
## 2022-09-28 ENCOUNTER — Encounter (HOSPITAL_BASED_OUTPATIENT_CLINIC_OR_DEPARTMENT_OTHER): Payer: Self-pay | Admitting: Physical Therapy

## 2022-09-28 ENCOUNTER — Ambulatory Visit (HOSPITAL_BASED_OUTPATIENT_CLINIC_OR_DEPARTMENT_OTHER): Payer: Medicare HMO | Attending: Anesthesiology | Admitting: Physical Therapy

## 2022-09-28 DIAGNOSIS — M6281 Muscle weakness (generalized): Secondary | ICD-10-CM | POA: Diagnosis not present

## 2022-09-28 DIAGNOSIS — R252 Cramp and spasm: Secondary | ICD-10-CM | POA: Diagnosis not present

## 2022-09-28 DIAGNOSIS — M5459 Other low back pain: Secondary | ICD-10-CM | POA: Diagnosis not present

## 2022-09-28 DIAGNOSIS — R2689 Other abnormalities of gait and mobility: Secondary | ICD-10-CM | POA: Insufficient documentation
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
## 2022-09-30 ENCOUNTER — Ambulatory Visit (HOSPITAL_BASED_OUTPATIENT_CLINIC_OR_DEPARTMENT_OTHER): Payer: Medicare HMO | Admitting: Physical Therapy

## 2022-10-01 ENCOUNTER — Ambulatory Visit: Payer: Medicare HMO | Attending: Anesthesiology | Admitting: Physical Therapy

## 2022-10-01 ENCOUNTER — Encounter: Payer: Self-pay | Admitting: Physical Therapy

## 2022-10-01 DIAGNOSIS — M6281 Muscle weakness (generalized): Secondary | ICD-10-CM | POA: Diagnosis not present

## 2022-10-01 DIAGNOSIS — R252 Cramp and spasm: Secondary | ICD-10-CM | POA: Diagnosis not present

## 2022-10-01 DIAGNOSIS — M5459 Other low back pain: Secondary | ICD-10-CM

## 2022-10-01 DIAGNOSIS — R2689 Other abnormalities of gait and mobility: Secondary | ICD-10-CM
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
## 2022-10-05 ENCOUNTER — Ambulatory Visit (HOSPITAL_BASED_OUTPATIENT_CLINIC_OR_DEPARTMENT_OTHER): Payer: Medicare HMO | Admitting: Physical Therapy

## 2022-10-07 ENCOUNTER — Encounter (HOSPITAL_BASED_OUTPATIENT_CLINIC_OR_DEPARTMENT_OTHER): Payer: Self-pay | Admitting: Physical Therapy

## 2022-10-07 ENCOUNTER — Ambulatory Visit (HOSPITAL_BASED_OUTPATIENT_CLINIC_OR_DEPARTMENT_OTHER): Payer: Medicare HMO | Admitting: Physical Therapy

## 2022-10-07 DIAGNOSIS — R2689 Other abnormalities of gait and mobility: Secondary | ICD-10-CM

## 2022-10-07 DIAGNOSIS — M6281 Muscle weakness (generalized): Secondary | ICD-10-CM

## 2022-10-07 DIAGNOSIS — M5459 Other low back pain: Secondary | ICD-10-CM

## 2022-10-07 DIAGNOSIS — R252 Cramp and spasm: Secondary | ICD-10-CM
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
## 2022-10-12 ENCOUNTER — Ambulatory Visit: Payer: Medicare HMO | Admitting: Rehabilitative and Restorative Service Providers"

## 2022-10-12 ENCOUNTER — Encounter: Payer: Self-pay | Admitting: Rehabilitative and Restorative Service Providers"

## 2022-10-12 DIAGNOSIS — M5459 Other low back pain: Secondary | ICD-10-CM

## 2022-10-12 DIAGNOSIS — R2689 Other abnormalities of gait and mobility: Secondary | ICD-10-CM

## 2022-10-12 DIAGNOSIS — M6281 Muscle weakness (generalized): Secondary | ICD-10-CM | POA: Diagnosis not present

## 2022-10-12 DIAGNOSIS — I4891 Unspecified atrial fibrillation: Secondary | ICD-10-CM | POA: Diagnosis not present

## 2022-10-12 DIAGNOSIS — R252 Cramp and spasm: Secondary | ICD-10-CM

## 2022-10-12 DIAGNOSIS — Z7901 Long term (current) use of anticoagulants: Secondary | ICD-10-CM | POA: Diagnosis not present
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
## 2022-11-04 ENCOUNTER — Other Ambulatory Visit: Payer: Medicare HMO | Admitting: Occupational Therapy

## 2022-11-04 NOTE — Patient Outreach (Signed)
Aging Gracefully Program  OT Follow-Up Visit  11/04/2022  Nancy Thomas 1950/11/02 [id number]  Visit:  3- Third Visit  Start Time:  1400 End Time:  9030 Total Minutes:  50  Readiness to Change Score :  Readiness to Change Score: 10  Patient Education: Education Provided: Yes Education Details: Getting up from a fall AND safety checklist for home handouts given and discussed Person(s) Educated: Patient Comprehension: Verbalized Understanding  Session today focused on education on falls and safety at home.   Post Clinical Reasoning: Clinician View Of Client Situation:: Nancy Thomas is still doing well.She is not going to aquatic therapy now that she has finished her therapy and because it is the winter months and the water at the [facility] is not as warm as the therapeutic pool. She does continue to walk around [place] in [place] neighborhood and stays busy with volunteering and making sweet goodies for various occassions. Client View Of His/Her Situation:: Nancy Thomas feels she is doing well and looks forward to the home modifications and repairs to be completed. Next Visit Plan:: follow up on home modifications completed.  Tye [place], [place] Aging Gracefully [phone]

## 2022-11-09 DIAGNOSIS — I4891 Unspecified atrial fibrillation: Secondary | ICD-10-CM | POA: Diagnosis not present

## 2022-11-09 DIAGNOSIS — Z7901 Long term (current) use of anticoagulants: Secondary | ICD-10-CM | POA: Diagnosis not present

## 2022-12-07 DIAGNOSIS — I4891 Unspecified atrial fibrillation: Secondary | ICD-10-CM | POA: Diagnosis not present

## 2022-12-07 DIAGNOSIS — Z7901 Long term (current) use of anticoagulants: Secondary | ICD-10-CM | POA: Diagnosis not present

## 2022-12-22 ENCOUNTER — Other Ambulatory Visit: Payer: Self-pay | Admitting: Occupational Therapy

## 2022-12-23 NOTE — Patient Outreach (Signed)
Aging Gracefully Program  OT FINAL Visit  12/23/2022  Nancy Thomas 12/30/1950 [id number]  Visit:  4- Fourth Visit  Start Time:  [id number] End Time:  1630 Total Minutes:  60  Readiness to Change:  Readiness to Change Score: 10  Patient Education: Education Provided: Yes Education Details: Tips for Aging at [employer]) Educated: Patient Comprehension: Verbalized Understanding  Goals:  Goals Addressed             This Visit's Progress    COMPLETED: Patient Stated       She would like to feel more safe and independent in her bathroom and the guest bathroom (lower threshold in walk in shower with shower dam, built in seat removed, additional grab bar put on wall outside shower on the right as you enter shower, higher toilets in both bathrooms.). The threshold step into the shower stall could not be removed. The other modifications were completed with 2 additional grab bars added. MET  Name: Nancy Thomas   Date: 12/22/2022   OT ACTION PLAN: Bathing   Target Problem Area:     Safety in Bathroom  Why Problem May Occur:      High step into shower  Decreased mobility  Current shower seat in the way of safely showering  No grab bars to help step in and out of shower  Toilets too low     Target Goal(s):     Increased safety and ease with showering and toileting    STRATEGIES    Saving Your Energy DO:  Use a tub seat in shower as needed   Use a long-handled sponge  Keep frequently used items within easy reach  Make sure all items you will need or setup before beginning to shower        Modifying your home environment and making it safe     DO:  Install grab bars in the shower  Place a rubber mat the entire length of shower (may have to cut hole in it for drain) OR use non skid adhesive strips  Make sure the bathroom is well-lit  Use higher toilets that have now been installed      PRACTICE   Based on what we have talked about, you are willing to:    Keep using modifications that have been completed and recommended     If an idea does not work the first time, try it again (and again) or modify to make it meet your needs.          Golden Circle         12/22/2022 Occupational Therapist                        Date     COMPLETED: Patient Stated       Feel safer getting in back door (grab bar on left hand side of back as you enter). MET, also a handrail was placed as well.  Name: Nancy Thomas   Date: 12/22/2022   OT ACTION PLAN: Functional Mobility   Target Problem Area:     Decreased mobility/safety in and out of back door  Why Problem May Occur:     Step/ramp at back door without anything to hold onto   Decreased balance      Target Goal(s):     Safety in and out of back door    STRATEGIES    Modifying your home environment and making it safe  DO:  Install grab bar at back door on one side  Install handrail at back door on one side     PRACTICE  Based on what we have talked about, you are willing to continue to:   Continue to use grab bar and handrail at back  Golden Circle, OTR/L     12/22/2022 Occupational Therapist       Date         Post Clinical Reasoning: Clinician View Of Client Situation:: Ms. Sparks continues to remain active with walking in her neighborhood, volunteering, and making goodies for those she knows. It has been a pleasure working with Ms. Seppala through the [employer]. Ambulance person Of His/Her Situation:: Ms. Frantom is happy with all the work that [facility] has completed for her except the kitchen and main room flooring due to poor workmanship of the installers, thus the flooring install was stopped and she will get someone else to do it. Next Visit Plan:: This was the last visit with Ms. Gwenette Greet, [place] Aging Gracefully [phone]

## 2022-12-28 ENCOUNTER — Telehealth: Payer: Self-pay | Admitting: *Deleted

## 2022-12-28 NOTE — Progress Notes (Signed)
  Care Coordination  Outreach Note  12/28/2022 Name: Nancy Thomas MRN: [id number] DOB: [birth year]   Care Coordination Outreach Attempts: An unsuccessful telephone outreach was attempted today to offer the patient information about available care coordination services as a benefit of their health plan.   Follow Up Plan:  Additional outreach attempts will be made to offer the patient care coordination information and services.   Encounter Outcome:  No Answer  Julian Hy, [place] Direct Dial: [phone]

## 2022-12-30 NOTE — Progress Notes (Signed)
  Care Coordination  Outreach Note  12/30/2022 Name: Nancy Thomas MRN: [id number] DOB: 09-05-1951   Care Coordination Outreach Attempts: A second unsuccessful outreach was attempted today to offer the patient with information about available care coordination services as a benefit of their health plan.     Follow Up Plan:  Additional outreach attempts will be made to offer the patient care coordination information and services.   Encounter Outcome:  No Answer  Julian Hy, [place] Direct Dial: [phone]

## 2023-01-04 DIAGNOSIS — Z7901 Long term (current) use of anticoagulants: Secondary | ICD-10-CM | POA: Diagnosis not present

## 2023-01-04 DIAGNOSIS — I4891 Unspecified atrial fibrillation: Secondary | ICD-10-CM | POA: Diagnosis not present

## 2023-01-12 NOTE — Progress Notes (Signed)
  Care Coordination   Note   01/12/2023 Name: Nancy Thomas MRN: [id number] DOB: 26-Mar-1951  Nancy Thomas is a 72 y.o. year old female who sees Tally Joe, MD for primary care. I reached out to Bobbye [place] by phone today to offer care coordination services.  [name] was given information about Care Coordination services today including:   The Care Coordination services include support from the care team which includes your Nurse Coordinator, Clinical Social Worker, or Pharmacist.  The Care Coordination team is here to help remove barriers to the health concerns and goals most important to you. Care Coordination services are voluntary, and the patient may decline or stop services at any time by request to their care team member.   Care Coordination Consent Status: Patient agreed to services and verbal consent obtained.   Follow up plan:  Telephone appointment with care coordination team member scheduled for:  01/26/2023  Encounter Outcome:  Pt. Scheduled  Burman Nieves, CCMA Care Coordination Care Guide Direct Dial: [phone]

## 2023-01-12 NOTE — Progress Notes (Signed)
  Care Coordination  Outreach Note  01/12/2023 Name: Nancy Thomas MRN: [id number] DOB: 1951/03/05   Care Coordination Outreach Attempts: A third unsuccessful outreach was attempted today to offer the patient with information about available care coordination services as a benefit of their health plan.   Follow Up Plan:  No further outreach attempts will be made at this time. We have been unable to contact the patient to offer or enroll patient in care coordination services  Encounter Outcome:  No Answer  Burman Nieves, [facility] Care Coordination Care Guide Direct Dial: [phone]

## 2023-01-24 DIAGNOSIS — R3129 Other microscopic hematuria: Secondary | ICD-10-CM | POA: Diagnosis not present

## 2023-01-24 DIAGNOSIS — N390 Urinary tract infection, site not specified: Secondary | ICD-10-CM | POA: Diagnosis not present

## 2023-01-26 ENCOUNTER — Ambulatory Visit: Payer: Self-pay

## 2023-01-28 NOTE — Patient Outreach (Signed)
  Care Coordination   Initial Visit Note   01/26/2023 Name: Nancy Thomas MRN: [id number] DOB: [birth year]  Nancy Thomas is a 72 y.o. year old female who sees Nancy Joe, MD for primary care. I spoke with  Nancy Thomas by phone today.  What matters to the patients health and wellness today?  Nancy Thomas is a proactive patient who manages her aches and pains through regular walks and water exercises. Additionally, she takes Tylenol and her prescribed medications. Her active involvement in managing her health with her providers means that she presently does not require any further assistance from me.    Goals Addressed             This Visit's Progress    COMPLETED: Care Coordination Activites- no follow up required        Care Coordination Interventions: Active listening / Reflection utilized  Emotional Support Provided Problem Solving /Task Center strategies reviewed Discussed/.Educated Care Coordination Program 2.   Discussed/.Educated Annual Wellness Visit 3.   Discussed/.Educated Social Determinates of Health 4.   Please inform PCP if services needed in the future            SDOH assessments and interventions completed:  Yes  SDOH Interventions Today    Flowsheet Row Most Recent Value  SDOH Interventions   Food Insecurity Interventions Intervention Not Indicated  Transportation Interventions Intervention Not Indicated        Care Coordination Interventions:  Yes, provided   Interventions Today    Flowsheet Row Most Recent Value  General Interventions   General Interventions Discussed/Reviewed General Interventions Discussed  [Care coordination Program]        Follow up plan: No further intervention required.   Encounter Outcome:  Pt. Visit Completed   Juanell Fairly RN, BSN, [facility] Care Coordinator [facility]   Phone: [phone]

## 2023-01-28 NOTE — Patient Instructions (Signed)
Visit Information  Thank you for taking time to visit with me today. Please don't hesitate to contact me if I can be of assistance to you.   Following are the goals we discussed today:   Goals Addressed             This Visit's Progress    COMPLETED: Care Coordination Activites - no follow up required        Care Coordination Interventions: Active listening / Reflection utilized  Emotional Support Provided Problem Solving /Task Center strategies reviewed Discussed/.Educated Care Coordination Program 2.   Discussed/.Educated Annual Wellness Visit 3.   Discussed/.Educated Social Determinates of Health 4.   Please inform PCP if services needed in the future          If you are experiencing a Mental Health or Behavioral Health Crisis or need someone to talk to, please call 1-800-273-TALK (toll free, 24 hour hotline)  The patient verbalized understanding of instructions, educational materials, and care plan provided today.   Dhani Dannemiller RN, BSN, CPC Care Coordinator [facility]   Phone: [phone]         

## 2023-02-01 DIAGNOSIS — I4891 Unspecified atrial fibrillation: Secondary | ICD-10-CM | POA: Diagnosis not present

## 2023-02-01 DIAGNOSIS — Z7901 Long term (current) use of anticoagulants: Secondary | ICD-10-CM | POA: Diagnosis not present

## 2023-02-04 ENCOUNTER — Other Ambulatory Visit: Payer: Self-pay

## 2023-02-04 NOTE — Patient Outreach (Signed)
Aging Gracefully Program  02/04/2023  Nancy Thomas 04-28-1951 [id number]   [facility] Evaluation Interviewer attempted to call patient on today regarding Aging Gracefully referral. No answer from patient after multiple rings. CMA left confidential voicemail for patient to return call.  Will attempt to call back within 1 week.   Vanice Sarah Care Management Assistant [phone]

## 2023-02-08 ENCOUNTER — Other Ambulatory Visit: Payer: Self-pay

## 2023-02-08 NOTE — Patient Outreach (Signed)
Aging Gracefully Program  02/08/2023  Nancy Thomas July 11, 1951 [id number]   [facility] Evaluation Interviewer made contact with patient. Aging Gracefully 5 month survey completed.   Vanice Sarah Care Management Assistant [phone]

## 2023-03-04 DIAGNOSIS — Z23 Encounter for immunization: Secondary | ICD-10-CM | POA: Diagnosis not present

## 2023-03-04 DIAGNOSIS — Z6841 Body Mass Index (BMI) 40.0 and over, adult: Secondary | ICD-10-CM | POA: Diagnosis not present

## 2023-03-04 DIAGNOSIS — I4891 Unspecified atrial fibrillation: Secondary | ICD-10-CM | POA: Diagnosis not present

## 2023-03-04 DIAGNOSIS — I10 Essential (primary) hypertension: Secondary | ICD-10-CM | POA: Diagnosis not present

## 2023-03-04 DIAGNOSIS — E1169 Type 2 diabetes mellitus with other specified complication: Secondary | ICD-10-CM | POA: Diagnosis not present

## 2023-03-04 DIAGNOSIS — E782 Mixed hyperlipidemia: Secondary | ICD-10-CM | POA: Diagnosis not present

## 2023-03-04 DIAGNOSIS — D6869 Other thrombophilia: Secondary | ICD-10-CM | POA: Diagnosis not present

## 2023-03-04 DIAGNOSIS — Z1331 Encounter for screening for depression: Secondary | ICD-10-CM | POA: Diagnosis not present

## 2023-03-04 DIAGNOSIS — Z0000 Encounter for general adult medical examination without abnormal findings: Secondary | ICD-10-CM | POA: Diagnosis not present

## 2023-03-04 DIAGNOSIS — E119 Type 2 diabetes mellitus without complications: Secondary | ICD-10-CM | POA: Diagnosis not present

## 2023-03-14 DIAGNOSIS — M65341 Trigger finger, right ring finger: Secondary | ICD-10-CM | POA: Diagnosis not present

## 2023-04-05 DIAGNOSIS — Z7901 Long term (current) use of anticoagulants: Secondary | ICD-10-CM | POA: Diagnosis not present

## 2023-04-05 DIAGNOSIS — I4891 Unspecified atrial fibrillation: Secondary | ICD-10-CM | POA: Diagnosis not present

## 2023-04-22 DIAGNOSIS — N390 Urinary tract infection, site not specified: Secondary | ICD-10-CM | POA: Diagnosis not present

## 2023-04-28 DIAGNOSIS — Z7901 Long term (current) use of anticoagulants: Secondary | ICD-10-CM | POA: Diagnosis not present

## 2023-04-28 DIAGNOSIS — I4891 Unspecified atrial fibrillation: Secondary | ICD-10-CM | POA: Diagnosis not present

## 2023-05-03 DIAGNOSIS — R3915 Urgency of urination: Secondary | ICD-10-CM | POA: Diagnosis not present

## 2023-05-03 DIAGNOSIS — N390 Urinary tract infection, site not specified: Secondary | ICD-10-CM | POA: Diagnosis not present

## 2023-05-13 DIAGNOSIS — N3020 Other chronic cystitis without hematuria: Secondary | ICD-10-CM | POA: Diagnosis not present

## 2023-05-31 DIAGNOSIS — I4891 Unspecified atrial fibrillation: Secondary | ICD-10-CM | POA: Diagnosis not present

## 2023-05-31 DIAGNOSIS — Z7901 Long term (current) use of anticoagulants: Secondary | ICD-10-CM | POA: Diagnosis not present

## 2023-06-21 ENCOUNTER — Other Ambulatory Visit: Payer: Self-pay

## 2023-06-21 DIAGNOSIS — N10 Acute tubulo-interstitial nephritis: Secondary | ICD-10-CM | POA: Diagnosis not present

## 2023-06-21 DIAGNOSIS — N390 Urinary tract infection, site not specified: Secondary | ICD-10-CM | POA: Diagnosis not present

## 2023-06-21 NOTE — Patient Outreach (Signed)
Aging Gracefully Program  06/21/2023  Nancy Thomas March 08, 1951 [id number]   [facility] Evaluation Interviewer attempted to call patient on today regarding Aging Gracefully referral. No answer from patient after multiple rings. CMA left confidential voicemail for patient to return call.  Will attempt to call back within 1 week.   Vanice Sarah Care Management Assistant [phone]

## 2023-06-28 DIAGNOSIS — I4891 Unspecified atrial fibrillation: Secondary | ICD-10-CM | POA: Diagnosis not present

## 2023-06-28 DIAGNOSIS — Z7901 Long term (current) use of anticoagulants: Secondary | ICD-10-CM | POA: Diagnosis not present

## 2023-07-12 ENCOUNTER — Other Ambulatory Visit: Payer: Self-pay

## 2023-07-12 NOTE — Patient Outreach (Signed)
Aging Gracefully Program  07/12/2023  Nancy Thomas 05/18/1951 [id number]   [facility] Evaluation Interviewer made contact with patient. Aging Gracefully 9 month survey completed.    Vanice Sarah Care Management Assistant [phone]

## 2023-07-13 DIAGNOSIS — N1330 Unspecified hydronephrosis: Secondary | ICD-10-CM | POA: Diagnosis not present

## 2023-07-13 DIAGNOSIS — R8271 Bacteriuria: Secondary | ICD-10-CM | POA: Diagnosis not present

## 2023-07-13 DIAGNOSIS — N3020 Other chronic cystitis without hematuria: Secondary | ICD-10-CM | POA: Diagnosis not present

## 2023-07-26 DIAGNOSIS — Z7901 Long term (current) use of anticoagulants: Secondary | ICD-10-CM | POA: Diagnosis not present

## 2023-07-26 DIAGNOSIS — I4891 Unspecified atrial fibrillation: Secondary | ICD-10-CM | POA: Diagnosis not present

## 2023-07-29 DIAGNOSIS — R350 Frequency of micturition: Secondary | ICD-10-CM | POA: Diagnosis not present

## 2023-08-23 DIAGNOSIS — Z7901 Long term (current) use of anticoagulants: Secondary | ICD-10-CM | POA: Diagnosis not present

## 2023-08-23 DIAGNOSIS — D6869 Other thrombophilia: Secondary | ICD-10-CM | POA: Diagnosis not present

## 2023-08-24 DIAGNOSIS — N3020 Other chronic cystitis without hematuria: Secondary | ICD-10-CM | POA: Diagnosis not present

## 2023-08-24 DIAGNOSIS — R3915 Urgency of urination: Secondary | ICD-10-CM | POA: Diagnosis not present

## 2023-08-24 DIAGNOSIS — R350 Frequency of micturition: Secondary | ICD-10-CM | POA: Diagnosis not present

## 2023-09-06 DIAGNOSIS — I4891 Unspecified atrial fibrillation: Secondary | ICD-10-CM | POA: Diagnosis not present

## 2023-09-06 DIAGNOSIS — Z7901 Long term (current) use of anticoagulants: Secondary | ICD-10-CM | POA: Diagnosis not present

## 2023-10-02 DIAGNOSIS — N390 Urinary tract infection, site not specified: Secondary | ICD-10-CM | POA: Diagnosis not present

## 2023-10-04 DIAGNOSIS — I4891 Unspecified atrial fibrillation: Secondary | ICD-10-CM | POA: Diagnosis not present

## 2023-10-04 DIAGNOSIS — Z7901 Long term (current) use of anticoagulants: Secondary | ICD-10-CM | POA: Diagnosis not present

## 2023-10-18 DIAGNOSIS — I4891 Unspecified atrial fibrillation: Secondary | ICD-10-CM | POA: Diagnosis not present

## 2023-10-18 DIAGNOSIS — Z7901 Long term (current) use of anticoagulants: Secondary | ICD-10-CM | POA: Diagnosis not present

## 2023-11-15 DIAGNOSIS — I4891 Unspecified atrial fibrillation: Secondary | ICD-10-CM | POA: Diagnosis not present

## 2023-11-15 DIAGNOSIS — Z7901 Long term (current) use of anticoagulants: Secondary | ICD-10-CM | POA: Diagnosis not present

## 2023-12-13 DIAGNOSIS — Z7901 Long term (current) use of anticoagulants: Secondary | ICD-10-CM | POA: Diagnosis not present

## 2023-12-13 DIAGNOSIS — I4891 Unspecified atrial fibrillation: Secondary | ICD-10-CM | POA: Diagnosis not present

## 2023-12-19 DIAGNOSIS — R350 Frequency of micturition: Secondary | ICD-10-CM | POA: Diagnosis not present

## 2023-12-19 DIAGNOSIS — R3915 Urgency of urination: Secondary | ICD-10-CM | POA: Diagnosis not present

## 2023-12-19 DIAGNOSIS — N3020 Other chronic cystitis without hematuria: Secondary | ICD-10-CM | POA: Diagnosis not present

## 2023-12-29 DIAGNOSIS — Z7901 Long term (current) use of anticoagulants: Secondary | ICD-10-CM | POA: Diagnosis not present

## 2023-12-29 DIAGNOSIS — I4891 Unspecified atrial fibrillation: Secondary | ICD-10-CM | POA: Diagnosis not present

## 2024-01-24 DIAGNOSIS — I4891 Unspecified atrial fibrillation: Secondary | ICD-10-CM | POA: Diagnosis not present

## 2024-01-24 DIAGNOSIS — Z7901 Long term (current) use of anticoagulants: Secondary | ICD-10-CM | POA: Diagnosis not present

## 2024-02-07 DIAGNOSIS — Z7901 Long term (current) use of anticoagulants: Secondary | ICD-10-CM | POA: Diagnosis not present

## 2024-02-07 DIAGNOSIS — I4891 Unspecified atrial fibrillation: Secondary | ICD-10-CM | POA: Diagnosis not present

## 2024-02-21 DIAGNOSIS — Z7901 Long term (current) use of anticoagulants: Secondary | ICD-10-CM | POA: Diagnosis not present

## 2024-03-06 DIAGNOSIS — M65341 Trigger finger, right ring finger: Secondary | ICD-10-CM | POA: Diagnosis not present

## 2024-03-06 DIAGNOSIS — E782 Mixed hyperlipidemia: Secondary | ICD-10-CM | POA: Diagnosis not present

## 2024-03-06 DIAGNOSIS — I83892 Varicose veins of left lower extremities with other complications: Secondary | ICD-10-CM | POA: Diagnosis not present

## 2024-03-06 DIAGNOSIS — Z0000 Encounter for general adult medical examination without abnormal findings: Secondary | ICD-10-CM | POA: Diagnosis not present

## 2024-03-06 DIAGNOSIS — E1169 Type 2 diabetes mellitus with other specified complication: Secondary | ICD-10-CM | POA: Diagnosis not present

## 2024-03-06 DIAGNOSIS — I4891 Unspecified atrial fibrillation: Secondary | ICD-10-CM | POA: Diagnosis not present

## 2024-03-06 DIAGNOSIS — I10 Essential (primary) hypertension: Secondary | ICD-10-CM | POA: Diagnosis not present

## 2024-03-06 DIAGNOSIS — Z1331 Encounter for screening for depression: Secondary | ICD-10-CM | POA: Diagnosis not present

## 2024-03-06 DIAGNOSIS — Z7901 Long term (current) use of anticoagulants: Secondary | ICD-10-CM | POA: Diagnosis not present

## 2024-03-06 DIAGNOSIS — M85852 Other specified disorders of bone density and structure, left thigh: Secondary | ICD-10-CM | POA: Diagnosis not present

## 2024-03-26 DIAGNOSIS — I10 Essential (primary) hypertension: Secondary | ICD-10-CM | POA: Diagnosis not present

## 2024-03-26 DIAGNOSIS — I4891 Unspecified atrial fibrillation: Secondary | ICD-10-CM | POA: Diagnosis not present

## 2024-03-26 DIAGNOSIS — E1169 Type 2 diabetes mellitus with other specified complication: Secondary | ICD-10-CM | POA: Diagnosis not present

## 2024-04-02 DIAGNOSIS — E2839 Other primary ovarian failure: Secondary | ICD-10-CM | POA: Diagnosis not present

## 2024-04-02 DIAGNOSIS — M8588 Other specified disorders of bone density and structure, other site: Secondary | ICD-10-CM | POA: Diagnosis not present

## 2024-04-02 DIAGNOSIS — R29890 Loss of height: Secondary | ICD-10-CM | POA: Diagnosis not present

## 2024-04-02 DIAGNOSIS — M85852 Other specified disorders of bone density and structure, left thigh: Secondary | ICD-10-CM | POA: Diagnosis not present

## 2024-04-03 DIAGNOSIS — Z7901 Long term (current) use of anticoagulants: Secondary | ICD-10-CM | POA: Diagnosis not present

## 2024-04-03 DIAGNOSIS — I4891 Unspecified atrial fibrillation: Secondary | ICD-10-CM | POA: Diagnosis not present

## 2024-04-26 DIAGNOSIS — E1169 Type 2 diabetes mellitus with other specified complication: Secondary | ICD-10-CM | POA: Diagnosis not present

## 2024-04-26 DIAGNOSIS — I4891 Unspecified atrial fibrillation: Secondary | ICD-10-CM | POA: Diagnosis not present

## 2024-04-26 DIAGNOSIS — I10 Essential (primary) hypertension: Secondary | ICD-10-CM | POA: Diagnosis not present

## 2024-05-01 DIAGNOSIS — M810 Age-related osteoporosis without current pathological fracture: Secondary | ICD-10-CM | POA: Diagnosis not present

## 2024-05-01 DIAGNOSIS — Z7901 Long term (current) use of anticoagulants: Secondary | ICD-10-CM | POA: Diagnosis not present

## 2024-05-01 DIAGNOSIS — I4891 Unspecified atrial fibrillation: Secondary | ICD-10-CM | POA: Diagnosis not present

## 2024-05-27 DIAGNOSIS — I4891 Unspecified atrial fibrillation: Secondary | ICD-10-CM | POA: Diagnosis not present

## 2024-05-27 DIAGNOSIS — E1169 Type 2 diabetes mellitus with other specified complication: Secondary | ICD-10-CM | POA: Diagnosis not present

## 2024-05-27 DIAGNOSIS — I10 Essential (primary) hypertension: Secondary | ICD-10-CM | POA: Diagnosis not present

## 2024-05-29 DIAGNOSIS — Z7901 Long term (current) use of anticoagulants: Secondary | ICD-10-CM | POA: Diagnosis not present

## 2024-05-29 DIAGNOSIS — I4891 Unspecified atrial fibrillation: Secondary | ICD-10-CM | POA: Diagnosis not present

## 2024-06-22 DIAGNOSIS — R3915 Urgency of urination: Secondary | ICD-10-CM | POA: Diagnosis not present

## 2024-06-22 DIAGNOSIS — R350 Frequency of micturition: Secondary | ICD-10-CM | POA: Diagnosis not present

## 2024-06-22 DIAGNOSIS — N3020 Other chronic cystitis without hematuria: Secondary | ICD-10-CM | POA: Diagnosis not present

## 2024-06-26 DIAGNOSIS — E1169 Type 2 diabetes mellitus with other specified complication: Secondary | ICD-10-CM | POA: Diagnosis not present

## 2024-06-26 DIAGNOSIS — I4891 Unspecified atrial fibrillation: Secondary | ICD-10-CM | POA: Diagnosis not present

## 2024-06-26 DIAGNOSIS — I10 Essential (primary) hypertension: Secondary | ICD-10-CM | POA: Diagnosis not present

## 2024-06-29 DIAGNOSIS — I4891 Unspecified atrial fibrillation: Secondary | ICD-10-CM | POA: Diagnosis not present

## 2024-06-29 DIAGNOSIS — Z7901 Long term (current) use of anticoagulants: Secondary | ICD-10-CM | POA: Diagnosis not present

## 2024-07-27 DIAGNOSIS — I10 Essential (primary) hypertension: Secondary | ICD-10-CM | POA: Diagnosis not present

## 2024-07-27 DIAGNOSIS — E1169 Type 2 diabetes mellitus with other specified complication: Secondary | ICD-10-CM | POA: Diagnosis not present

## 2024-07-27 DIAGNOSIS — I4891 Unspecified atrial fibrillation: Secondary | ICD-10-CM | POA: Diagnosis not present

## 2024-07-31 DIAGNOSIS — Z7901 Long term (current) use of anticoagulants: Secondary | ICD-10-CM | POA: Diagnosis not present

## 2024-07-31 DIAGNOSIS — I4891 Unspecified atrial fibrillation: Secondary | ICD-10-CM | POA: Diagnosis not present

## 2024-08-28 DIAGNOSIS — I4891 Unspecified atrial fibrillation: Secondary | ICD-10-CM | POA: Diagnosis not present

## 2024-08-28 DIAGNOSIS — Z7901 Long term (current) use of anticoagulants: Secondary | ICD-10-CM | POA: Diagnosis not present
